# Patient Record
Sex: Female | Born: 2000 | Race: Black or African American | Hispanic: No | Marital: Single | Smoking: Never smoker
Health system: Southern US, Community
[De-identification: ages and names within clinical notes are randomized; demographics above are authoritative.]

## PROBLEM LIST (undated history)

## (undated) DIAGNOSIS — N83209 Unspecified ovarian cyst, unspecified side: Secondary | ICD-10-CM

## (undated) DIAGNOSIS — K219 Gastro-esophageal reflux disease without esophagitis: Secondary | ICD-10-CM

## (undated) HISTORY — PX: NO PAST SURGERIES: SHX2092

---
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
## 2019-08-21 ENCOUNTER — Ambulatory Visit: Payer: BLUE CROSS/BLUE SHIELD | Admitting: Internal Medicine

## 2019-08-21 NOTE — Progress Notes (Deleted)
Patient is a 19 year old female who presents today new to the practice.  Upon review of her record, she has most recently been seen in the [facility] system, with her having an infertility clinic visit in December 2020 with obstetrics and gynecology.. She also has had office visits with their internal medicine department twice in March, and once in early April for URI symptoms, bacterial vaginosis, and exposure to SARS most recently on 08/18/2019.  The Covid test was negative.  Previous visits have been through the emergency department. Her most recent visit was in January 2019 for episodes of dizziness.  At that visit, her BMP, CBC were normal, and her urinalysis and pregnancy test were negative.  Her past medical history is remarkable for GERD, eczema, lactose intolerance, and an ovarian cyst.

## 2019-10-20 ENCOUNTER — Other Ambulatory Visit: Payer: Self-pay

## 2019-10-20 ENCOUNTER — Encounter: Payer: Self-pay | Admitting: Emergency Medicine

## 2019-10-20 DIAGNOSIS — Z3A01 Less than 8 weeks gestation of pregnancy: Secondary | ICD-10-CM | POA: Insufficient documentation

## 2019-10-20 DIAGNOSIS — R102 Pelvic and perineal pain: Secondary | ICD-10-CM | POA: Insufficient documentation

## 2019-10-20 DIAGNOSIS — N898 Other specified noninflammatory disorders of vagina: Secondary | ICD-10-CM | POA: Diagnosis not present

## 2019-10-20 DIAGNOSIS — Z5321 Procedure and treatment not carried out due to patient leaving prior to being seen by health care provider: Secondary | ICD-10-CM | POA: Diagnosis not present

## 2019-10-20 DIAGNOSIS — O26891 Other specified pregnancy related conditions, first trimester: Secondary | ICD-10-CM | POA: Insufficient documentation

## 2019-10-20 LAB — URINALYSIS, COMPLETE (UACMP) WITH MICROSCOPIC
Bacteria, UA: NONE SEEN
Bilirubin Urine: NEGATIVE
Glucose, UA: NEGATIVE mg/dL
Hgb urine dipstick: NEGATIVE
Ketones, ur: NEGATIVE mg/dL
Leukocytes,Ua: NEGATIVE
Nitrite: NEGATIVE
Protein, ur: NEGATIVE mg/dL
Specific Gravity, Urine: 1.028 (ref 1.005–1.030)
pH: 6 (ref 5.0–8.0)

## 2019-10-20 LAB — COMPREHENSIVE METABOLIC PANEL
ALT: 22 U/L (ref 0–44)
AST: 23 U/L (ref 15–41)
Albumin: 4 g/dL (ref 3.5–5.0)
Alkaline Phosphatase: 40 U/L (ref 38–126)
Anion gap: 5 (ref 5–15)
BUN: 15 mg/dL (ref 6–20)
CO2: 26 mmol/L (ref 22–32)
Calcium: 8.9 mg/dL (ref 8.9–10.3)
Chloride: 104 mmol/L (ref 98–111)
Creatinine, Ser: 0.81 mg/dL (ref 0.44–1.00)
GFR calc Af Amer: >60 mL/min (ref >60)
GFR calc non Af Amer: >60 mL/min (ref >60)
Glucose, Bld: 100 mg/dL — ABNORMAL HIGH (ref 70–99)
Potassium: 3.8 mmol/L (ref 3.5–5.1)
Sodium: 135 mmol/L (ref 135–145)
Total Bilirubin: 0.7 mg/dL (ref 0.3–1.2)
Total Protein: 7.5 g/dL (ref 6.5–8.1)

## 2019-10-20 LAB — CBC
HCT: 34.1 % — ABNORMAL LOW (ref 36.0–46.0)
Hemoglobin: 11.3 g/dL — ABNORMAL LOW (ref 12.0–15.0)
MCH: 28.1 pg (ref 26.0–34.0)
MCHC: 33.1 g/dL (ref 30.0–36.0)
MCV: 84.8 fL (ref 80.0–100.0)
Platelets: 296 10*3/uL (ref 150–400)
RBC: 4.02 MIL/uL (ref 3.87–5.11)
RDW: 12.8 % (ref 11.5–15.5)
WBC: 8.8 10*3/uL (ref 4.0–10.5)
nRBC: 0 % (ref 0.0–0.2)

## 2019-10-20 LAB — ABO/RH: ABO/RH(D): A POS

## 2019-10-20 MED ORDER — SODIUM CHLORIDE 0.9% FLUSH: 3.0000 mL | Freq: Once | INTRAVENOUS | Status: DC

## 2019-10-20 NOTE — ED Triage Notes (Signed)
Pt to ED from work c/o vaginal discharge and vaginal cramping that started around 2215.  Pt is [redacted] weeks pregnant without complications, had a prior miscarriage.

## 2019-10-21 ENCOUNTER — Emergency Department
Admission: EM | Admit: 2019-10-21 | Discharge: 2019-10-21 | Disposition: A | Discharge status: Home / Self Care | Payer: PRIVATE HEALTH INSURANCE | Attending: Emergency Medicine | Admitting: Emergency Medicine

## 2019-10-21 LAB — HCG, QUANTITATIVE, PREGNANCY: hCG, Beta Chain, Quant, S: 24496 m[IU]/mL — ABNORMAL HIGH (ref <5)

## 2019-11-11 DIAGNOSIS — O09899 Supervision of other high risk pregnancies, unspecified trimester: Secondary | ICD-10-CM | POA: Insufficient documentation

## 2019-11-11 DIAGNOSIS — Z2839 Other underimmunization status: Secondary | ICD-10-CM | POA: Insufficient documentation

## 2019-11-19 ENCOUNTER — Telehealth: Payer: Self-pay | Admitting: Obstetrics & Gynecology

## 2019-11-19 NOTE — Telephone Encounter (Signed)
Pt called about referral for [facility] from [facility] but nothing in chart and [place] Dec was at lunch to ask. Advised medicaid plan she had was OON but still can be seen but in order to continue care with [place] she would have to get it changed to one we take. Pt got attitude and hung up.

## 2019-11-25 ENCOUNTER — Telehealth: Payer: Self-pay | Admitting: Obstetrics & Gynecology

## 2019-11-25 NOTE — Telephone Encounter (Signed)
Sabrina Singh referring for Pregnancy examination or positive result. Medicaid on file not in network will need to advise patient. Paper records. Called and left voicemail for patient to call back to be scheduled.

## 2019-12-09 ENCOUNTER — Ambulatory Visit (INDEPENDENT_AMBULATORY_CARE_PROVIDER_SITE_OTHER): Payer: Medicaid Other | Admitting: Advanced Practice Midwife

## 2019-12-09 ENCOUNTER — Encounter: Payer: Self-pay | Admitting: Advanced Practice Midwife

## 2019-12-09 ENCOUNTER — Other Ambulatory Visit: Payer: Self-pay

## 2019-12-09 VITALS — BP 90/56 | HR 87 | Wt 177.0 lb

## 2019-12-09 DIAGNOSIS — Z3A13 13 weeks gestation of pregnancy: Secondary | ICD-10-CM

## 2019-12-09 DIAGNOSIS — Z3481 Encounter for supervision of other normal pregnancy, first trimester: Secondary | ICD-10-CM

## 2019-12-09 DIAGNOSIS — Z3490 Encounter for supervision of normal pregnancy, unspecified, unspecified trimester: Secondary | ICD-10-CM | POA: Insufficient documentation

## 2019-12-09 DIAGNOSIS — Z3482 Encounter for supervision of other normal pregnancy, second trimester: Secondary | ICD-10-CM

## 2019-12-09 NOTE — Progress Notes (Signed)
New Obstetric Patient H&P    Chief Complaint: "Desires prenatal care"   History of Present Illness: Patient is a 19 y.o. G2P0010 Not Hispanic or Latino female, presents with amenorrhea and positive home pregnancy test. Patient's last menstrual period was 09/09/2019. and based on her  LMP, her EDD is Estimated Date of Delivery: 06/15/20 and her EGA is [redacted]w[redacted]d. Cycles are 5 days, regular, and occur approximately every : 28 days. Her last pap smear was 1 years ago and was no abnormalities.    She had a urine pregnancy test which was positive 6 week(s)  ago. Her last menstrual period was normal and lasted for  5 day(s). Since her LMP she claims she has experienced breast tenderness, fatigue, nausea, vomiting. She denies vaginal bleeding. Her past medical history is noncontributory. Her prior pregnancies are notable for SAB 2017  Since her LMP, she admits to the use of tobacco products  no She claims she has gained   7 pounds since the start of her pregnancy.  There are cats in the home in the home  no  She admits close contact with children on a regular basis  no  She has had chicken pox in the past no She has had Tuberculosis exposures, symptoms, or previously tested positive for TB   no Current or past history of domestic violence. no  Genetic Screening/Teratology Counseling: (Includes patient, baby's father, or anyone in either family with:)   1. Patient's age >/= 52 at [facility]  no 2. Thalassemia ([place], [place], Mediterranean, or Asian background): MCV<80  no 3. Neural tube defect (meningomyelocele, spina bifida, anencephaly)  no 4. Congenital heart defect  no  5. Down syndrome  no 6. Tay-Sachs (Jewish, [place]  no 7. Canavan's Disease  no 8. Sickle cell disease or trait (African)  no  9. Hemophilia or other blood disorders  no  10. Muscular dystrophy  no  11. Cystic fibrosis  no  12. Huntington's Chorea  no  13. Mental retardation/autism  no 14. Other inherited genetic or  chromosomal disorder  no 15. Maternal metabolic disorder (DM, PKU, etc)  no 16. Patient or FOB with a child with a birth defect not listed above no  16a. Patient or FOB with a birth defect themselves no 17. Recurrent pregnancy loss, or stillbirth  no  18. Any medications since LMP other than prenatal vitamins (include vitamins, supplements, OTC meds, drugs, alcohol)  no 19. Any other genetic/environmental exposure to discuss  no  Infection History:   1. Lives with someone with TB or TB exposed  no  2. Patient or partner has history of genital herpes  no 3. Rash or viral illness since LMP  no 4. History of STI (GC, CT, HPV, syphilis, HIV)  CT tx'ed 2 years ago, Trich tx'ed 6 years ago 5. History of recent travel :  no  Other pertinent information:  no     Review of Systems:10 point review of systems negative unless otherwise noted in HPI  Past Medical History:  Patient Active Problem List   Diagnosis Date Noted  . Supervision of normal pregnancy 12/09/2019    Clinic [facility]  Dating  Blood type: --/--/A POS Performed at [facility], [address] [place], [place] [address]  [phone])   Genetic Screen 1 Screen:    AFP:     Quad:     NIPS: Antibody: negative  Anatomic [place]  Rubella: Immune  Varicella: Non-immune  GTT Early: 99  Third trimester:  RPR: non-reactive   Rhogam  HBsAg: negative   Vaccines TDAP:                       Flu Shot: Covid: HIV: negative   Baby Food undecided                               GBS:   GC/CT: negative/negative  Contraception  Pap: 04/09/19 negative  CBB     CS/VBAC NA   Support Person [name]         Past Surgical History:  History reviewed. No pertinent surgical history.  Gynecologic History: Patient's last menstrual period was 09/09/2019.  Obstetric History: G2P0010  Family History:  History reviewed. No pertinent family history.  Social History:  Social History   Socioeconomic History   . Marital status: Single    Spouse name: Not on file  . Number of children: Not on file  . Years of education: Not on file  . Highest education level: Not on file  Occupational History  . Not on file  Tobacco Use  . Smoking status: Never Smoker  . Smokeless tobacco: Never Used  Substance and Sexual Activity  . Alcohol use: No  . Drug use: No  . Sexual activity: Yes    Birth control/protection: None  Other Topics Concern  . Not on file  Social History Narrative  . Not on file   Social Determinants of Health   Financial Resource Strain:   . Difficulty of Paying Living Expenses:   Food Insecurity:   . Worried About Programme researcher, broadcasting/film/video in the Last Year:   . Barista in the Last Year:   Transportation Needs:   . Freight forwarder (Medical):   Marland Kitchen Lack of Transportation (Non-Medical):   Physical Activity:   . Days of Exercise per Week:   . Minutes of Exercise per Session:   Stress:   . Feeling of Stress :   Social Connections:   . Frequency of Communication with Friends and Family:   . Frequency of Social Gatherings with Friends and Family:   . Attends Religious Services:   . Active Member of Clubs or Organizations:   . Attends Banker Meetings:   Marland Kitchen Marital Status:   Intimate Partner Violence:   . Fear of Current or Ex-Partner:   . Emotionally Abused:   Marland Kitchen Physically Abused:   . Sexually Abused:     Allergies:  No Known Allergies  Medications: Prior to Admission medications   Medication Sig Start Date End Date Taking? Authorizing Provider  Prenat MV-Min-Methylfolate-FA (PRENATE) 0.6-0.4 MG CHEW Chew by mouth. 11/02/15  Yes [provider]    Physical Exam Vitals: Blood pressure (!) 90/56, pulse 87, weight 177 lb (80.3 kg), last menstrual period 09/09/2019  General: NAD HEENT: normocephalic, anicteric Thyroid: no enlargement, no palpable nodules Pulmonary: No increased work of breathing, CTAB Cardiovascular: RRR, distal pulses  2+ Abdomen: NABS, soft, non-tender, non-distended.  Umbilicus without lesions.  No hepatomegaly, splenomegaly or masses palpable. No evidence of hernia. FHTs 170s Genitourinary: deferred for PAP interval/already had aptima Extremities: no edema, erythema, or tenderness Neurologic: Grossly intact Psychiatric: mood appropriate, affect full  The following were addressed during this visit:  Breastfeeding Education - Early initiation of breastfeeding    Comments: Keeps milk supply adequate, helps contract uterus and slow bleeding, and early milk is the perfect  first food and is easy to digest.   - The importance of exclusive breastfeeding    Comments: Provides antibodies, Lower risk of breast and ovarian cancers, and type-2 diabetes,Helps your body recover, Reduced chance of SIDS.   - Risks of giving your baby anything other than breast milk if you are breastfeeding    Comments: Make the baby less content with breastfeeds, may make my baby more susceptible to illness, and may reduce my milk supply.   - The importance of early skin-to-skin contact    Comments: Keeps baby warm and secure, helps keep baby's blood sugar up and breathing steady, easier to bond and breastfeed, and helps calm baby.  - Rooming-in on a 24-hour basis    Comments: Easier to learn baby's feeding cues, easier to bond and get to know each other, and encourages milk production.   - Feeding on demand or baby-led feeding    Comments: Helps prevent breastfeeding complications, helps bring in good milk supply, prevents under or overfeeding, and helps baby feel content and satisfied   - Frequent feeding to help assure optimal milk production    Comments: Making a full supply of milk requires frequent removal of milk from breasts, infant will eat 8-12 times in 24 hours, if separated from infant use breast massage, hand expression and/ or pumping to remove milk from breasts.   - Effective positioning and attachment     Comments: Helps my baby to get enough breast milk, helps to produce an adequate milk supply, and helps prevent nipple pain and damage   - Exclusive breastfeeding for the first 6 months    Comments: Builds a healthy milk supply and keeps it up, protects baby from sickness and disease, and breastmilk has everything your baby needs for the first 6 months.    Assessment: 19 y.o. G2P0010 at [redacted]w[redacted]d by LMP presenting to initiate prenatal care  Plan: 1) Avoid alcoholic beverages. 2) Patient encouraged not to smoke.  3) Discontinue the use of all non-medicinal drugs and chemicals.  4) Take prenatal vitamins daily.  5) Nutrition, food safety (fish, cheese advisories, and high nitrite foods) and exercise discussed. 6) Hospital and practice style discussed with cross coverage system.  7) Genetic Screening, such as with 1st Trimester Screening, cell free fetal DNA, AFP testing, and Ultrasound, as well as with amniocentesis and CVS as appropriate, is discussed with patient. At the conclusion of today's visit patient requested cell free DNA genetic testing 8) Patient is asked about travel to areas at risk for the Zika virus, and counseled to avoid travel and exposure to mosquitoes or sexual partners who may have themselves been exposed to the virus. Testing is discussed, and will be ordered as appropriate. 9) MaterniT 21 today  10) Return to clinic in 1 week for dating scan and rob 58) Magnesium supplement for migraines   Tresea Mall, CNM [facility] 12/09/2019, 5:39 PM

## 2019-12-09 NOTE — Progress Notes (Signed)
NOB Migraines

## 2019-12-09 NOTE — Patient Instructions (Signed)
Exercise During Pregnancy Exercise is an important part of being healthy for people of all ages. Exercise improves the function of your heart and lungs and helps you maintain strength, flexibility, and a healthy body weight. Exercise also boosts energy levels and elevates mood. Most women should exercise regularly during pregnancy. In rare cases, women with certain medical conditions or complications may be asked to limit or avoid exercise during pregnancy. How does this affect me? Along with maintaining general strength and flexibility, exercising during pregnancy can help:  Keep strength in muscles that are used during labor and childbirth.  Decrease low back pain.  Reduce symptoms of depression.  Control weight gain during pregnancy.  Reduce the risk of needing insulin if you develop diabetes during pregnancy.  Decrease the risk of cesarean delivery.  Speed up your recovery after giving birth. How does this affect my baby? Exercise can help you have a healthy pregnancy. Exercise does not cause premature birth. It will not cause your baby to weigh less at birth. What exercises can I do? Many exercises are safe for you to do during pregnancy. Do a variety of exercises that safely increase your heart and breathing rates and help you build and maintain muscle strength. Do exercises exactly as told by your health care provider. You may do these exercises:  Walking or hiking.  Swimming.  Water aerobics.  Riding a stationary bike.  Strength training.  Modified yoga or Pilates. Tell your instructor that you are pregnant. Avoid overstretching, and avoid lying on your back for long periods of time.  Running or jogging. Only choose this type of exercise if you: ? Ran or jogged regularly before your pregnancy. ? Can run or jog and still talk in complete sentences. What exercises should I avoid? Depending on your level of fitness and whether you exercised regularly before your  pregnancy, you may be told to limit high-intensity exercise. You can tell that you are exercising at a high intensity if you are breathing much harder and faster and cannot hold a conversation while exercising. You must avoid:  Contact sports.  Activities that put you at risk for falling on or being hit in the belly, such as downhill skiing, water skiing, surfing, rock climbing, cycling, gymnastics, and horseback riding.  Scuba diving.  Skydiving.  Yoga or Pilates in a room that is heated to high temperatures.  Jogging or running, unless you ran or jogged regularly before your pregnancy. While jogging or running, you should always be able to talk in full sentences. Do not run or jog so fast that you are unable to have a conversation.  Do not exercise at more than 6,000 feet above sea level (high elevation) if you are not used to exercising at high elevation. How do I exercise in a safe way?   Avoid overheating. Do not exercise in very high temperatures.  Wear loose-fitting, breathable clothes.  Avoid dehydration. Drink enough water before, during, and after exercise to keep your urine pale yellow.  Avoid overstretching. Because of hormone changes during pregnancy, it is easy to overstretch muscles, tendons, and ligaments during pregnancy.  Start slowly and ask your health care provider to recommend the types of exercise that are safe for you.  Do not exercise to lose weight. Follow these instructions at home:  Exercise on most days or all days of the week. Try to exercise for 30 minutes a day, 5 days a week, unless your health care provider tells you not to.  If   you actively exercised before your pregnancy and you are healthy, your health care provider may tell you to continue to do moderate to high-intensity exercise.  If you are just starting to exercise or did not exercise much before your pregnancy, your health care provider may tell you to do low to moderate-intensity  exercise. Questions to ask your health care provider  Is exercise safe for me?  What are signs that I should stop exercising?  Does my health condition mean that I should not exercise during pregnancy?  When should I avoid exercising during pregnancy? Stop exercising and contact a health care provider if: You have any unusual symptoms, such as:  Mild contractions of the uterus or cramps in the abdomen.  Dizziness that does not go away when you rest. Stop exercising and get help right away if: You have any unusual symptoms, such as:  Sudden, severe pain in your low back or your belly.  Mild contractions of the uterus or cramps in the abdomen that do not improve with rest and drinking fluids.  Chest pain.  Bleeding or fluid leaking from your vagina.  Shortness of breath. These symptoms may represent a serious problem that is an emergency. Do not wait to see if the symptoms will go away. Get medical help right away. Call your local emergency services (911 in the [place] Do not drive yourself to the hospital. Summary  Most women should exercise regularly throughout pregnancy. In rare cases, women with certain medical conditions or complications may be asked to limit or avoid exercise during pregnancy.  Do not exercise to lose weight during pregnancy.  Your health care provider will tell you what level of physical activity is right for you.  Stop exercising and contact a health care provider if you have mild contractions of the uterus or cramps in the abdomen. Get help right away if these contractions or cramps do not improve with rest and drinking fluids.  Stop exercising and get help right away if you have sudden, severe pain in your low back or belly, chest pain, shortness of breath, or bleeding or leaking of fluid from your vagina. This information is not intended to replace advice given to you by your health care provider. Make sure you discuss any questions you have with your  health care provider. Document Revised: 08/14/2018 Document Reviewed: 05/28/2018 Elsevier Patient Education  2020 [employer] Eating Plan for Pregnant Women While you are pregnant, your body requires additional nutrition to help support your growing baby. You also have a higher need for some vitamins and minerals, such as folic acid, calcium, iron, and vitamin D. Eating a healthy, well-balanced diet is very important for your health and your baby's health. Your need for extra calories varies for the three 3-month segments of your pregnancy (trimesters). For most women, it is recommended to consume:  150 extra calories a day during the first trimester.  300 extra calories a day during the second trimester.  300 extra calories a day during the third trimester. What are tips for following this plan?   Do not try to lose weight or go on a diet during pregnancy.  Limit your overall intake of foods that have "empty calories." These are foods that have little nutritional value, such as sweets, desserts, candies, and sugar-sweetened beverages.  Eat a variety of foods (especially fruits and vegetables) to get a full range of vitamins and minerals.  Take a prenatal vitamin to help meet your additional vitamin and mineral needs   during pregnancy, specifically for folic acid, iron, calcium, and vitamin D.  Remember to stay active. Ask your health care provider what types of exercise and activities are safe for you.  Practice good food safety and cleanliness. Wash your hands before you eat and after you prepare raw meat. Wash all fruits and vegetables well before peeling or eating. Taking these actions can help to prevent food-borne illnesses that can be very dangerous to your baby, such as listeriosis. Ask your health care provider for more information about listeriosis. What does 150 extra calories look like? Healthy options that provide 150 extra calories each day could be any of the  following:  6-8 oz (170-230 g) of plain low-fat yogurt with  cup of berries.  1 apple with 2 teaspoons (11 g) of peanut butter.  Cut-up vegetables with  cup (60 g) of hummus.  8 oz (230 mL) or 1 cup of low-fat chocolate milk.  1 stick of string cheese with 1 medium orange.  1 peanut butter and jelly sandwich that is made with one slice of whole-wheat bread and 1 tsp (5 g) of peanut butter. For 300 extra calories, you could eat two of those healthy options each day. What is a healthy amount of weight to gain? The right amount of weight gain for you is based on your BMI before you became pregnant. If your BMI:  Was less than 18 (underweight), you should gain 28-40 lb (13-18 kg).  Was 18-24.9 (normal), you should gain 25-35 lb (11-16 kg).  Was 25-29.9 (overweight), you should gain 15-25 lb (7-11 kg).  Was 30 or greater (obese), you should gain 11-20 lb (5-9 kg). What if I am having twins or multiples? Generally, if you are carrying twins or multiples:  You may need to eat 300-600 extra calories a day.  The recommended range for total weight gain is 25-54 lb (11-25 kg), depending on your BMI before pregnancy.  Talk with your health care provider to find out about nutritional needs, weight gain, and exercise that is right for you. What foods can I eat?  Fruits All fruits. Eat a variety of colors and types of fruit. Remember to wash your fruits well before peeling or eating. Vegetables All vegetables. Eat a variety of colors and types of vegetables. Remember to wash your vegetables well before peeling or eating. Grains All grains. Choose whole grains, such as whole-wheat bread, oatmeal, or brown rice. Meats and other protein foods Lean meats, including chicken, turkey, fish, and lean cuts of beef, veal, or pork. If you eat fish or seafood, choose options that are higher in omega-3 fatty acids and lower in mercury, such as salmon, herring, mussels, trout, sardines, pollock,  shrimp, crab, and lobster. Tofu. Tempeh. Beans. Eggs. Peanut butter and other nut butters. Make sure that all meats, poultry, and eggs are cooked to food-safe temperatures or "well-done." Two or more servings of fish are recommended each week in order to get the most benefits from omega-3 fatty acids that are found in seafood. Choose fish that are lower in mercury. You can find more information online:  www.fda.gov Dairy Pasteurized milk and milk alternatives (such as almond milk). Pasteurized yogurt and pasteurized cheese. Cottage cheese. Sour cream. Beverages Water. Juices that contain 100% fruit juice or vegetable juice. Caffeine-free teas and decaffeinated coffee. Drinks that contain caffeine are okay to drink, but it is better to avoid caffeine. Keep your total caffeine intake to less than 200 mg each day (which is 12 oz   or 355 mL of coffee, tea, or soda) or the limit as told by your health care provider. Fats and oils Fats and oils are okay to include in moderation. Sweets and desserts Sweets and desserts are okay to include in moderation. Seasoning and other foods All pasteurized condiments. The items listed above may not be a complete list of foods and beverages you can eat. Contact a dietitian for more information. What foods are not recommended? Fruits Unpasteurized fruit juices. Vegetables Raw (unpasteurized) vegetable juices. Meats and other protein foods Lunch meats, bologna, hot dogs, or other deli meats. (If you must eat those meats, reheat them until they are steaming hot.) Refrigerated pat, meat spreads from a meat counter, smoked seafood that is found in the refrigerated section of a store. Raw or undercooked meats, poultry, and eggs. Raw fish, such as sushi or sashimi. Fish that have high mercury content, such as tilefish, shark, swordfish, and king mackerel. To learn more about mercury in fish, talk with your health care provider or look for online resources, such  as:  www.fda.gov Dairy Raw (unpasteurized) milk and any foods that have raw milk in them. Soft cheeses, such as feta, queso blanco, queso fresco, Brie, Camembert cheeses, blue-veined cheeses, and Panela cheese (unless it is made with pasteurized milk, which must be stated on the label). Beverages Alcohol. Sugar-sweetened beverages, such as sodas, teas, or energy drinks. Seasoning and other foods Homemade fermented foods and drinks, such as pickles, sauerkraut, or kombucha drinks. (Store-bought pasteurized versions of these are okay.) Salads that are made in a store or deli, such as ham salad, chicken salad, egg salad, tuna salad, and seafood salad. The items listed above may not be a complete list of foods and beverages you should avoid. Contact a dietitian for more information. Where to find more information To calculate the number of calories you need based on your height, weight, and activity level, you can use an online calculator such as:  www.choosemyplate.gov/MyPlatePlan To calculate how much weight you should gain during pregnancy, you can use an online pregnancy weight gain calculator such as:  www.choosemyplate.gov/pregnancy-weight-gain-calculator Summary  While you are pregnant, your body requires additional nutrition to help support your growing baby.  Eat a variety of foods, especially fruits and vegetables to get a full range of vitamins and minerals.  Practice good food safety and cleanliness. Wash your hands before you eat and after you prepare raw meat. Wash all fruits and vegetables well before peeling or eating. Taking these actions can help to prevent food-borne illnesses, such as listeriosis, that can be very dangerous to your baby.  Do not eat raw meat or fish. Do not eat fish that have high mercury content, such as tilefish, shark, swordfish, and king mackerel. Do not eat unpasteurized (raw) dairy.  Take a prenatal vitamin to help meet your additional vitamin and  mineral needs during pregnancy, specifically for folic acid, iron, calcium, and vitamin D. This information is not intended to replace advice given to you by your health care provider. Make sure you discuss any questions you have with your health care provider. Document Revised: 09/11/2018 Document Reviewed: 01/18/2017 Elsevier Patient Education  2020 Elsevier Inc. Prenatal Care Prenatal care is health care during pregnancy. It helps you and your unborn baby (fetus) stay as healthy as possible. Prenatal care may be provided by a midwife, a family practice health care provider, or a childbirth and pregnancy specialist (obstetrician). How does this affect me? During pregnancy, you will be closely monitored   for any new conditions that might develop. To lower your risk of pregnancy complications, you and your health care provider will talk about any underlying conditions you have. How does this affect my baby? Early and consistent prenatal care increases the chance that your baby will be healthy during pregnancy. Prenatal care lowers the risk that your baby will be:  Born early (prematurely).  Smaller than expected at birth (small for gestational age). What can I expect at the first prenatal care visit? Your first prenatal care visit will likely be the longest. You should schedule your first prenatal care visit as soon as you know that you are pregnant. Your first visit is a good time to talk about any questions or concerns you have about pregnancy. At your visit, you and your health care provider will talk about:  Your medical history, including: ? Any past pregnancies. ? Your family's medical history. ? The baby's father's medical history. ? Any long-term (chronic) health conditions you have and how you manage them. ? Any surgeries or procedures you have had. ? Any current over-the-counter or prescription medicines, herbs, or supplements you are taking.  Other factors that could pose a risk  to your baby, including:  Your home setting and your stress levels, including: ? Exposure to abuse or violence. ? Household financial strain. ? Mental health conditions you have.  Your daily health habits, including diet and exercise. Your health care provider will also:  Measure your weight, height, and blood pressure.  Do a physical exam, including a pelvic and breast exam.  Perform blood tests and urine tests to check for: ? Urinary tract infection. ? Sexually transmitted infections (STIs). ? Low iron levels in your blood (anemia). ? Blood type and certain proteins on red blood cells (Rh antibodies). ? Infections and immunity to viruses, such as hepatitis B and rubella. ? HIV (human immunodeficiency virus).  Do an ultrasound to confirm your baby's growth and development and to help predict your estimated due date (EDD). This ultrasound is done with a probe that is inserted into the vagina (transvaginal ultrasound).  Discuss your options for genetic screening.  Give you information about how to keep yourself and your baby healthy, including: ? Nutrition and taking vitamins. ? Physical activity. ? How to manage pregnancy symptoms such as nausea and vomiting (morning sickness). ? Infections and substances that may be harmful to your baby and how to avoid them. ? Food safety. ? Dental care. ? Working. ? Travel. ? Warning signs to watch for and when to call your health care provider. How often will I have prenatal care visits? After your first prenatal care visit, you will have regular visits throughout your pregnancy. The visit schedule is often as follows:  Up to week 28 of pregnancy: once every 4 weeks.  28-36 weeks: once every 2 weeks.  After 36 weeks: every week until delivery. Some women may have visits more or less often depending on any underlying health conditions and the health of the baby. Keep all follow-up and prenatal care visits as told by your health care  provider. This is important. What happens during routine prenatal care visits? Your health care provider will:  Measure your weight and blood pressure.  Check for fetal heart sounds.  Measure the height of your uterus in your abdomen (fundal height). This may be measured starting around week 20 of pregnancy.  Check the position of your baby inside your uterus.  Ask questions about your diet, sleeping patterns, and   whether you can feel the baby move.  Review warning signs to watch for and signs of labor.  Ask about any pregnancy symptoms you are having and how you are dealing with them. Symptoms may include: ? Headaches. ? Nausea and vomiting. ? Vaginal discharge. ? Swelling. ? Fatigue. ? Constipation. ? Any discomfort, including back or pelvic pain. Make a list of questions to ask your health care provider at your routine visits. What tests might I have during prenatal care visits? You may have blood, urine, and imaging tests throughout your pregnancy, such as:  Urine tests to check for glucose, protein, or signs of infection.  Glucose tests to check for a form of diabetes that can develop during pregnancy (gestational diabetes mellitus). This is usually done around week 24 of pregnancy.  An ultrasound to check your baby's growth and development and to check for birth defects. This is usually done around week 20 of pregnancy.  A test to check for group B strep (GBS) infection. This is usually done around week 36 of pregnancy.  Genetic testing. This may include blood or imaging tests, such as an ultrasound. Some genetic tests are done during the first trimester and some are done during the second trimester. What else can I expect during prenatal care visits? Your health care provider may recommend getting certain vaccines during pregnancy. These may include:  A yearly flu shot (annual influenza vaccine). This is especially important if you will be pregnant during flu  season.  Tdap (tetanus, diphtheria, pertussis) vaccine. Getting this vaccine during pregnancy can protect your baby from whooping cough (pertussis) after birth. This vaccine may be recommended between weeks 27 and 36 of pregnancy. Later in your pregnancy, your health care provider may give you information about:  Childbirth and breastfeeding classes.  Choosing a health care provider for your baby.  Umbilical cord banking.  Breastfeeding.  Birth control after your baby is born.  The hospital labor and delivery unit and how to tour it.  Registering at the hospital before you go into labor. Where to find more information  Office on Women's Health: womenshealth.gov  American Pregnancy Association: americanpregnancy.org  March of Dimes: marchofdimes.org Summary  Prenatal care helps you and your baby stay as healthy as possible during pregnancy.  Your first prenatal care visit will most likely be the longest.  You will have visits and tests throughout your pregnancy to monitor your health and your baby's health.  Bring a list of questions to your visits to ask your health care provider.  Make sure to keep all follow-up and prenatal care visits with your health care provider. This information is not intended to replace advice given to you by your health care provider. Make sure you discuss any questions you have with your health care provider. Document Revised: 08/13/2018 Document Reviewed: 04/22/2017 Elsevier Patient Education  2020 Elsevier Inc.  

## 2019-12-15 ENCOUNTER — Other Ambulatory Visit: Payer: Self-pay

## 2019-12-15 ENCOUNTER — Ambulatory Visit (INDEPENDENT_AMBULATORY_CARE_PROVIDER_SITE_OTHER): Payer: Medicaid Other

## 2019-12-15 ENCOUNTER — Ambulatory Visit (INDEPENDENT_AMBULATORY_CARE_PROVIDER_SITE_OTHER): Payer: Medicaid Other | Admitting: Certified Nurse Midwife

## 2019-12-15 VITALS — BP 99/62 | Wt 179.0 lb

## 2019-12-15 DIAGNOSIS — Z3A13 13 weeks gestation of pregnancy: Secondary | ICD-10-CM

## 2019-12-15 DIAGNOSIS — Z3482 Encounter for supervision of other normal pregnancy, second trimester: Secondary | ICD-10-CM | POA: Diagnosis not present

## 2019-12-15 DIAGNOSIS — Z3481 Encounter for supervision of other normal pregnancy, first trimester: Secondary | ICD-10-CM

## 2019-12-15 LAB — POCT URINALYSIS DIPSTICK OB
Glucose, UA: NEGATIVE
POC,PROTEIN,UA: NEGATIVE

## 2019-12-15 NOTE — Progress Notes (Signed)
No concerns.rj 

## 2019-12-18 ENCOUNTER — Other Ambulatory Visit: Payer: Self-pay

## 2019-12-18 ENCOUNTER — Ambulatory Visit (INDEPENDENT_AMBULATORY_CARE_PROVIDER_SITE_OTHER): Payer: Medicaid Other

## 2019-12-18 DIAGNOSIS — R1030 Lower abdominal pain, unspecified: Secondary | ICD-10-CM | POA: Diagnosis not present

## 2019-12-18 LAB — MATERNIT 21 PLUS CORE, BLOOD
Fetal Fraction: 8
Result (T21): NEGATIVE
Trisomy 13 (Patau syndrome): NEGATIVE
Trisomy 18 (Edwards syndrome): NEGATIVE
Trisomy 21 (Down syndrome): NEGATIVE

## 2019-12-18 LAB — POCT URINALYSIS DIPSTICK
Bilirubin, UA: NEGATIVE
Blood, UA: NEGATIVE
Glucose, UA: NEGATIVE
Ketones, UA: NEGATIVE
Leukocytes, UA: NEGATIVE
Nitrite, UA: NEGATIVE
Protein, UA: POSITIVE — AB
Spec Grav, UA: >=1.03 — AB
Urobilinogen, UA: NEGATIVE U/dL — AB
pH, UA: 5

## 2019-12-18 NOTE — Progress Notes (Addendum)
Pt dropped in to see if we would ck her urine; was added to nurses' sched; c/o urine feels extra hot when she pees, has white thin d/c, x3d;  Lower abd pain this am.  [facility] [address]; also hasn't heard from blood work results.  [phone]  Pt aware per CRS to drink more water.  We are sending a urine culture and should have the results Monday.  As for blood work, what was drawn earlier this week is not back yet but the MaterniT 21 is back.  Pt aware of neg results and gender put in envelop for pt to p/u.

## 2019-12-18 NOTE — Addendum Note (Signed)
Addended by: Loran Senters D on: 12/18/2019 11:43 AM   Modules accepted: Orders

## 2019-12-23 ENCOUNTER — Telehealth: Payer: Self-pay

## 2019-12-23 NOTE — Telephone Encounter (Signed)
Patient has a few questions about her pregnancy. Also inquiring about urine results from Friday. MY#[phone]

## 2019-12-23 NOTE — Telephone Encounter (Signed)
Spoke w/patients. Advised urine culture hasn't resulted yet. She states she had intercourse w/boyfriend about a week ago. He didn't pull out. Her symptoms started a few days later (creamy white d/c w/o itching). She also has migraines on occasion. Advised can take tylenol (two regular strength or one extra strength), also can have caffeine including chocolate (less than 200 milligrams per day) as caffeine can sometimes help with headaches. If not relieved by these methods, should contact us for evaluation/provider advice/rx.

## 2019-12-23 NOTE — Telephone Encounter (Signed)
Patient aware. Advised to arrive at 11:15. Apt scheduled.

## 2019-12-23 NOTE — Telephone Encounter (Signed)
Please see if she can see me tomorrow at 1130 for an evaluation and to get blood drawn. Thanks, [employer]

## 2019-12-23 NOTE — Telephone Encounter (Signed)
LMVM TRC. 

## 2019-12-24 ENCOUNTER — Other Ambulatory Visit: Payer: Self-pay

## 2019-12-24 ENCOUNTER — Ambulatory Visit (INDEPENDENT_AMBULATORY_CARE_PROVIDER_SITE_OTHER): Payer: Medicaid Other | Admitting: Certified Nurse Midwife

## 2019-12-24 VITALS — BP 95/68 | Wt 179.0 lb

## 2019-12-24 DIAGNOSIS — B9689 Other specified bacterial agents as the cause of diseases classified elsewhere: Secondary | ICD-10-CM

## 2019-12-24 DIAGNOSIS — N760 Acute vaginitis: Secondary | ICD-10-CM

## 2019-12-24 DIAGNOSIS — Z3A15 15 weeks gestation of pregnancy: Secondary | ICD-10-CM

## 2019-12-24 DIAGNOSIS — Z3482 Encounter for supervision of other normal pregnancy, second trimester: Secondary | ICD-10-CM

## 2019-12-24 DIAGNOSIS — R109 Unspecified abdominal pain: Secondary | ICD-10-CM

## 2019-12-24 DIAGNOSIS — N898 Other specified noninflammatory disorders of vagina: Secondary | ICD-10-CM

## 2019-12-24 LAB — POCT URINALYSIS DIPSTICK OB
Glucose, UA: NEGATIVE
POC,PROTEIN,UA: NEGATIVE

## 2019-12-24 LAB — POCT URINALYSIS DIPSTICK
Blood, UA: NEGATIVE
Glucose, UA: NEGATIVE
Ketones, UA: NEGATIVE
Nitrite, UA: NEGATIVE
Protein, UA: NEGATIVE
Spec Grav, UA: >=1.03 — AB (ref 1.010–1.025)
Urobilinogen, UA: NEGATIVE E.U./dL — AB
pH, UA: 6 (ref 5.0–8.0)

## 2019-12-24 LAB — URINE CULTURE

## 2019-12-24 MED ORDER — METRONIDAZOLE 0.75 % VA GEL: VAGINAL | 0 refills | Status: DC

## 2019-12-24 NOTE — Progress Notes (Signed)
Work in appointment at 15wk1d: Has numerous concerns: lightheaded when standing out in the heat for a period of time, lower abdominal pressure, vaginal discharge with odor since last week, headaches behind her eyes x 2 months, hard stools and anxiety regarding her previous pregnancy loss and the possibility of something going wrong with this pregnancy.  Trying not to take any medications for headaches. Drinking water. Cut out caffeine. Has not been eating fruit (less fiber in her diet).   Exam: BP 95/68 Wt 179# ( no change) FH at U-1/ FHT 160 Pelvic exam: External/BUS: no inflammation or lesions  Vagina: white homogenous discharge, no lesions Wet prep: positive clue cells and negative hyphae, Trich  Urine dipstick: trace leukocytes, trace bili, 1.030, negative nitrite, negative protein Urine culture from last week still pending (has been replated)  A: IUP at 15wk1d  Bacterial vaginosis Headaches in pregnancy Anxiety regarding pregnancy  P: Metrogel vaginal gel: one appl qhs x 5 nites Discussed safe medications in pregnancy-can take Tylenol for headaches. OK to drink a caffeinated product daily Explained risk of miscarriage is much lower now that she is in her second trimester No IC until done with BV treatment NOB labs drawn (did not realize she had labs done at [facility] in June 2021) ROB as scheduled  Farrel Conners, CNM

## 2019-12-24 NOTE — Progress Notes (Signed)
C/o pressure lower abd; migraines; feels she needs to poop but nothing comes out.rj

## 2019-12-27 NOTE — Progress Notes (Signed)
ROB/ dating scan at 13wk6d : Doing well.   BP 99/62. Weight up 2#(179#). Negative proteinuria Dating scan: CRL 13wk6d with FCA 161. FHT 165 with DT Has already had MaterniT 74( diploid XX) but has not had NOB labs  A: IUP at 13wk6d. S=D on ultrasound  P: RTO 3 weeks for ROB and MSAFP NOB labs today  Farrel Conners, CNM

## 2019-12-29 LAB — RPR+RH+ABO+RUB AB+AB SCR+CB...
Antibody Screen: NEGATIVE
HIV Screen 4th Generation wRfx: NONREACTIVE
Hematocrit: 32.8 % — ABNORMAL LOW (ref 34.0–46.6)
Hemoglobin: 10.9 g/dL — ABNORMAL LOW (ref 11.1–15.9)
Hepatitis B Surface Ag: NEGATIVE
MCH: 27.6 pg (ref 26.6–33.0)
MCHC: 33.2 g/dL (ref 31.5–35.7)
MCV: 83 fL (ref 79–97)
Platelets: 272 10*3/uL (ref 150–450)
RBC: 3.95 x10E6/uL (ref 3.77–5.28)
RDW: 12.8 % (ref 11.7–15.4)
RPR Ser Ql: NONREACTIVE
Rh Factor: POSITIVE
Rubella Antibodies, IGG: 2.3 index (ref >0.99)
Varicella zoster IgG: <135 index — ABNORMAL LOW (ref >165)
WBC: 9.9 10*3/uL (ref 3.4–10.8)

## 2019-12-29 LAB — HGB FRACTIONATION CASCADE
Hgb A2: 2.5 % (ref 1.8–3.2)
Hgb A: 97.5 % (ref 96.4–98.8)
Hgb F: 0 % (ref 0.0–2.0)
Hgb S: 0 %

## 2019-12-30 ENCOUNTER — Encounter: Payer: Medicaid Other | Admitting: Obstetrics and Gynecology

## 2019-12-30 ENCOUNTER — Telehealth: Payer: Self-pay | Admitting: Certified Nurse Midwife

## 2019-12-30 ENCOUNTER — Telehealth: Payer: Self-pay

## 2019-12-30 NOTE — Telephone Encounter (Signed)
Patient is calling back from being seen at the [place] [facility] walk in. Needs to follow up with a nurse please advise

## 2019-12-30 NOTE — Telephone Encounter (Signed)
Pt aware since she was seen at [facility]  they will have to send her in a RX, pt states her RX is not at the pharmacy, I advised her to call [facility] where she was seen and discuss issue with them, [facility] can not send in a rx for another practice.

## 2019-12-30 NOTE — Telephone Encounter (Signed)
Patient is returning call from leaving message on Nurse line this morning. Patient thinks she will go to the walk in. Please call patient with what CLG advises. Cb# [phone]

## 2019-12-30 NOTE — Telephone Encounter (Signed)
Pt called triage reporting BV symptoms have worsen, vaginal itching burning heavy discharge, called to schedule an appointment pt is at a walk in clinic now.

## 2020-01-05 ENCOUNTER — Other Ambulatory Visit: Payer: Self-pay

## 2020-01-05 ENCOUNTER — Encounter: Payer: Medicaid Other | Admitting: Obstetrics & Gynecology

## 2020-01-05 ENCOUNTER — Ambulatory Visit (INDEPENDENT_AMBULATORY_CARE_PROVIDER_SITE_OTHER): Payer: Medicaid Other | Admitting: Obstetrics

## 2020-01-05 VITALS — BP 100/70 | Wt 184.0 lb

## 2020-01-05 DIAGNOSIS — Z3482 Encounter for supervision of other normal pregnancy, second trimester: Secondary | ICD-10-CM

## 2020-01-05 DIAGNOSIS — Z3A16 16 weeks gestation of pregnancy: Secondary | ICD-10-CM

## 2020-01-05 NOTE — Progress Notes (Signed)
  Routine Prenatal Care Visit  Subjective  Sabrina Singh is a 19 y.o. G2P0010 at [redacted]w[redacted]d being seen today for ongoing prenatal care.  She is currently monitored for the following issues for this low-risk pregnancy and has Supervision of normal pregnancy on their problem list.  ----------------------------------------------------------------------------------- Patient reports no bleeding, no contractions, no cramping, no leaking and and she has completed taking medication for both BV and vaginal yeast..    . Vag. Bleeding: None.   . Leaking Fluid denies.  ----------------------------------------------------------------------------------- The following portions of the patient's history were reviewed and updated as appropriate: allergies, current medications, past family history, past medical history, past social history, past surgical history and problem list. Problem list updated.  Objective  Blood pressure 100/70, weight 184 lb (83.5 kg), last menstrual period 09/09/2019, unknown if currently breastfeeding. Pregravid weight 170 lb (77.1 kg) Total Weight Gain 14 lb (6.35 kg) Urinalysis: Urine Protein    Urine Glucose    Fetal Status:           General:  Alert, oriented and cooperative. Patient is in no acute distress.  Skin: Skin is warm and dry. No rash noted.   Cardiovascular: Normal heart rate noted  Respiratory: Normal respiratory effort, no problems with respiration noted  Abdomen: Soft, gravid, appropriate for gestational age.       Pelvic:  Cervical exam deferred        Extremities: Normal range of motion.     Mental Status: Normal mood and affect. Normal behavior. Normal judgment and thought content.   Assessment   19 y.o. G2P0010 at [redacted]w[redacted]d by  06/15/2020, by Last Menstrual Period presenting for routine prenatal visit  Plan   Pregnancy#2 Problems (from 09/09/19 to present)    Problem Noted Resolved   Supervision of normal pregnancy 12/09/2019 by Tresea Mall, CNM No   Overview  Addendum 01/05/2020  5:21 PM by [name], CNM    Clinic [facility] Prenatal Labs  Dating LMP=13wk6d [place] Blood type:   A positive  Genetic Screen 1 Screen:    AFP:     Quad:     NIPS: diploid XX Antibody: negative  Anatomic [place] Ordered  Rubella: Immune Varicella: VNI  GTT Early: 12               Third trimester:  RPR: non reactive  Rhogam  HBsAg: neg  Vaccines TDAP:                       Flu Shot: Covid: HIV:  negative  Baby Food undecided                               GBS:   GC/CT: negative/negative  Contraception  Pap: 04/09/19 negative  CBB     CS/VBAC NA   Support Person [name]          Previous Version       Preterm labor symptoms and general obstetric precautions including but not limited to vaginal bleeding, contractions, leaking of fluid and fetal movement were reviewed in detail with the patient. Please refer to After Visit Summary for other counseling recommendations.  Discussed her anatomy ultrasound to be scheduled for three weeks hence. Reminded to take her prenatal vitamins  Return in about 3 weeks (around 01/26/2020) for return OB and anatomy sono.  [name], CNM  01/05/2020 5:22 PM

## 2020-01-26 ENCOUNTER — Ambulatory Visit (INDEPENDENT_AMBULATORY_CARE_PROVIDER_SITE_OTHER): Payer: Medicaid Other | Admitting: Obstetrics

## 2020-01-26 ENCOUNTER — Ambulatory Visit (INDEPENDENT_AMBULATORY_CARE_PROVIDER_SITE_OTHER): Payer: Medicaid Other

## 2020-01-26 ENCOUNTER — Other Ambulatory Visit: Payer: Self-pay

## 2020-01-26 VITALS — BP 110/60 | Wt 188.0 lb

## 2020-01-26 DIAGNOSIS — Z3482 Encounter for supervision of other normal pregnancy, second trimester: Secondary | ICD-10-CM

## 2020-01-26 DIAGNOSIS — Z3A19 19 weeks gestation of pregnancy: Secondary | ICD-10-CM | POA: Diagnosis not present

## 2020-01-26 DIAGNOSIS — Z3A16 16 weeks gestation of pregnancy: Secondary | ICD-10-CM

## 2020-01-26 LAB — POCT URINALYSIS DIPSTICK OB
Glucose, UA: NEGATIVE
POC,PROTEIN,UA: NEGATIVE

## 2020-01-26 NOTE — Progress Notes (Signed)
C/o is vegan vitamins okay to take if you are not a vegan;  Trouble sleeping.rj

## 2020-01-26 NOTE — Progress Notes (Signed)
  Routine Prenatal Care Visit  Subjective  Sabrina Singh is a 19 y.o. G2P0010 at [redacted]w[redacted]d being seen today for ongoing prenatal care.  She is currently monitored for the following issues for this low-risk pregnancy and has Supervision of normal pregnancy on their problem list.  ----------------------------------------------------------------------------------- Patient reports no complaints.  She is happy with her sono pics today. Has named the baby "[place]" using vegan prenates and wants something else to try.  .  .   [name] Fluid denies.  ----------------------------------------------------------------------------------- The following portions of the patient's history were reviewed and updated as appropriate: allergies, current medications, past family history, past medical history, past social history, past surgical history and problem list. Problem list updated.  Objective  Blood pressure 110/60, weight 188 lb (85.3 kg), last menstrual period 09/09/2019, unknown if currently breastfeeding. Pregravid weight 170 lb (77.1 kg) Total Weight Gain 18 lb (8.165 kg) Urinalysis: Urine Protein Negative  Urine Glucose Negative  Fetal Status:           General:  Alert, oriented and cooperative. Patient is in no acute distress.  Skin: Skin is warm and dry. No rash noted.   Cardiovascular: Normal heart rate noted  Respiratory: Normal respiratory effort, no problems with respiration noted  Abdomen: Soft, gravid, appropriate for gestational age.       Pelvic:  Cervical exam deferred        Extremities: Normal range of motion.     Mental Status: Normal mood and affect. Normal behavior. Normal judgment and thought content.   Assessment   19 y.o. G2P0010 at [redacted]w[redacted]d by  06/15/2020, by Last Menstrual Period presenting for routine prenatal visit  Plan   Pregnancy#2 Problems (from 09/09/19 to present)    Problem Noted Resolved   Supervision of normal pregnancy 12/09/2019 by Tresea Mall, CNM No    Overview Addendum 01/05/2020  5:21 PM by [name], CNM    Clinic [facility] Prenatal Labs  Dating LMP=13wk6d [place] Blood type:   A positive  Genetic Screen 1 Screen:    AFP:     Quad:     NIPS: diploid XX Antibody: negative  Anatomic [place] Ordered  Rubella: Immune Varicella: VNI  GTT Early: 42               Third trimester:  RPR: non reactive  Rhogam  HBsAg: neg  Vaccines TDAP:                       Flu Shot: Covid: HIV:  negative  Baby Food undecided                               GBS:   GC/CT: negative/negative  Contraception  Pap: 04/09/19 negative  CBB     CS/VBAC NA   Support Person [name]          Previous Version       Preterm labor symptoms and general obstetric precautions including but not limited to vaginal bleeding, contractions, leaking of fluid and fetal movement were reviewed in detail with the patient. Please refer to After Visit Summary for other counseling recommendations.  We reviewed her sono report.  Samples of prenatal vitamins provided for her to try. No follow-ups on file.  [name], CNM  01/26/2020 3:28 PM

## 2020-02-02 ENCOUNTER — Other Ambulatory Visit: Payer: Self-pay | Admitting: Advanced Practice Midwife

## 2020-02-02 ENCOUNTER — Telehealth: Payer: Self-pay

## 2020-02-02 DIAGNOSIS — Z3482 Encounter for supervision of other normal pregnancy, second trimester: Secondary | ICD-10-CM

## 2020-02-02 MED ORDER — PRENATE PIXIE 10-0.6-0.4-200 MG PO CAPS: 1.0000 | ORAL_CAPSULE | Freq: Every day | ORAL | 8 refills | Status: DC

## 2020-02-02 NOTE — Progress Notes (Signed)
Rx prenate pixie to pharmacy per patient request

## 2020-02-02 NOTE — Telephone Encounter (Signed)
Pt was given samples of prenatal vitamins. She would like a Rx of Prenate Pixie sent to [facility].

## 2020-02-02 NOTE — Telephone Encounter (Signed)
Rx sent as requested- not sure if medicaid will cover

## 2020-02-08 NOTE — Telephone Encounter (Signed)
Patient aware.

## 2020-02-22 ENCOUNTER — Encounter: Payer: Medicaid Other | Admitting: Obstetrics

## 2020-02-26 ENCOUNTER — Other Ambulatory Visit: Payer: Self-pay

## 2020-02-26 ENCOUNTER — Ambulatory Visit (INDEPENDENT_AMBULATORY_CARE_PROVIDER_SITE_OTHER): Payer: Medicaid Other | Admitting: Obstetrics

## 2020-02-26 VITALS — BP 110/50 | Wt 192.0 lb

## 2020-02-26 DIAGNOSIS — Z3482 Encounter for supervision of other normal pregnancy, second trimester: Secondary | ICD-10-CM

## 2020-02-26 DIAGNOSIS — Z3A24 24 weeks gestation of pregnancy: Secondary | ICD-10-CM

## 2020-02-26 LAB — POCT URINALYSIS DIPSTICK OB
Glucose, UA: NEGATIVE
POC,PROTEIN,UA: NEGATIVE

## 2020-02-26 NOTE — Progress Notes (Signed)
  Routine Prenatal Care Visit  Subjective  Sabrina Singh is a 19 y.o. G2P0010 at [redacted]w[redacted]d being seen today for ongoing prenatal care.  She is currently monitored for the following issues for this low-risk pregnancy and has Supervision of normal pregnancy on their problem list.  ----------------------------------------------------------------------------------- Patient reports no complaints.    .  .   Sabrina Singh Fluid denies.  ----------------------------------------------------------------------------------- The following portions of the patient's history were reviewed and updated as appropriate: allergies, current medications, past family history, past medical history, past social history, past surgical history and problem list. Problem list updated.  Objective  Blood pressure (!) 110/50, weight 192 lb (87.1 kg), last menstrual period 09/09/2019, unknown if currently breastfeeding. Pregravid weight 170 lb (77.1 kg) Total Weight Gain 22 lb (9.979 kg) Urinalysis: Urine Protein Negative  Urine Glucose Negative  Fetal Status:           General:  Alert, oriented and cooperative. Patient is in no acute distress.  Skin: Skin is warm and dry. No rash noted.   Cardiovascular: Normal heart rate noted  Respiratory: Normal respiratory effort, no problems with respiration noted  Abdomen: Soft, gravid, appropriate for gestational Singh.       Pelvic:  Cervical exam deferred        Extremities: Normal range of motion.  Edema: None  Mental Status: Normal mood and affect. Normal behavior. Normal judgment and thought content.   Assessment   19 y.o. G2P0010 at [redacted]w[redacted]d by  06/15/2020, by Last Menstrual Period presenting for routine prenatal visit  Plan   Pregnancy#2 Problems (from 09/09/19 to present)    Problem Noted Resolved   Supervision of normal pregnancy 12/09/2019 by Tresea Mall, CNM No   Overview Addendum 02/26/2020 11:45 AM by [name], CNM    Clinic [facility] Prenatal Labs  Dating  LMP=13wk6d [place] Blood type:   A positive  Genetic Screen 1 Screen:    AFP:     Quad:     NIPS: diploid XX Antibody: negative  Anatomic [place] XX Rubella: Immune Varicella: VNI  GTT Early- 99 RPR: non reactive  Rhogam NA HBsAg: neg  Vaccines TDAP:                       Flu Shot: Covid: HIV:  negative  Baby Food undecided                               GBS:   GC/CT: negative/negative  Contraception  Pap: 04/09/19 negative  CBB     CS/VBAC NA   Support Person Sabrina Singh          Previous Version       Preterm labor symptoms and general obstetric precautions including but not limited to vaginal bleeding, contractions, leaking of fluid and fetal movement were reviewed in detail with the patient. Please refer to After Visit Summary for other counseling recommendations.   Return in about 4 weeks (around 03/25/2020) for return OB, 28 week labs.  [name], CNM  02/26/2020 1:17 PM

## 2020-02-26 NOTE — Progress Notes (Signed)
No concerns.rj 

## 2020-03-10 ENCOUNTER — Other Ambulatory Visit: Payer: Self-pay

## 2020-03-10 NOTE — Telephone Encounter (Signed)
Pt called after hour nurse 03/09/20 10:39pm for refill of her prenatal vitamin.  After hour nurse adv pt to call us today.  I have adv pt that she has refills on file at the pharmacy; to contact pharm.

## 2020-03-22 ENCOUNTER — Telehealth: Payer: Self-pay

## 2020-03-22 NOTE — Telephone Encounter (Signed)
FMLA/DISABILITY form for [employer] filled out, signature obtained and given to [facility] for processing.

## 2020-03-25 ENCOUNTER — Ambulatory Visit (INDEPENDENT_AMBULATORY_CARE_PROVIDER_SITE_OTHER): Payer: Medicaid Other | Admitting: Obstetrics and Gynecology

## 2020-03-25 ENCOUNTER — Other Ambulatory Visit: Payer: Self-pay

## 2020-03-25 ENCOUNTER — Other Ambulatory Visit: Payer: Medicaid Other

## 2020-03-25 VITALS — BP 115/70 | Ht <= 58 in | Wt 196.2 lb

## 2020-03-25 DIAGNOSIS — B9689 Other specified bacterial agents as the cause of diseases classified elsewhere: Secondary | ICD-10-CM

## 2020-03-25 DIAGNOSIS — Z3A24 24 weeks gestation of pregnancy: Secondary | ICD-10-CM

## 2020-03-25 DIAGNOSIS — N760 Acute vaginitis: Secondary | ICD-10-CM

## 2020-03-25 DIAGNOSIS — Z3A28 28 weeks gestation of pregnancy: Secondary | ICD-10-CM

## 2020-03-25 DIAGNOSIS — Z3482 Encounter for supervision of other normal pregnancy, second trimester: Secondary | ICD-10-CM | POA: Diagnosis not present

## 2020-03-25 MED ORDER — METRONIDAZOLE 500 MG PO TABS: 500.0000 mg | ORAL_TABLET | Freq: Two times a day (BID) | ORAL | 0 refills | Status: AC

## 2020-03-25 NOTE — Progress Notes (Signed)
    Routine Prenatal Care Visit  Subjective  Sabrina Singh is a 19 y.o. G2P0010 at [redacted]w[redacted]d being seen today for ongoing prenatal care.  She is currently monitored for the following issues for this low-risk pregnancy and has Supervision of normal pregnancy on their problem list.  ----------------------------------------------------------------------------------- Patient reports vaginal dischrage.   Contractions: Not present. Vag. Bleeding: None.  Movement: Present. Denies leaking of fluid.  ----------------------------------------------------------------------------------- The following portions of the patient's history were reviewed and updated as appropriate: allergies, current medications, past family history, past medical history, past social history, past surgical history and problem list. Problem list updated.   Objective  Blood pressure 115/70, height (!) 5.3" (0.135 m), weight 196 lb 3.2 oz (89 kg), last menstrual period 09/09/2019, unknown if currently breastfeeding. Pregravid weight 170 lb (77.1 kg) Total Weight Gain 26 lb 3.2 oz (11.9 kg) Urinalysis:      Fetal Status: Fetal Heart Rate (bpm): 130 Fundal Height: 28 cm Movement: Present     General:  Alert, oriented and cooperative. Patient is in no acute distress.  Skin: Skin is warm and dry. No rash noted.   Cardiovascular: Normal heart rate noted  Respiratory: Normal respiratory effort, no problems with respiration noted  Abdomen: Soft, gravid, appropriate for gestational age. Pain/Pressure: Absent     Pelvic:  Cervical exam deferred        Extremities: Normal range of motion.  Edema: None  Mental Status: Normal mood and affect. Normal behavior. Normal judgment and thought content.     Assessment   19 y.o. G2P0010 at [redacted]w[redacted]d by  06/15/2020, by Last Menstrual Period presenting for routine prenatal visit  Plan   Pregnancy#2 Problems (from 09/09/19 to present)    Problem Noted Resolved   Supervision of normal pregnancy  12/09/2019 by Tresea Mall, CNM No   Overview Addendum 03/25/2020 12:11 PM by Natale Milch, MD    Clinic [facility] Prenatal Labs  Dating LMP=13wk6d [place] Blood type:   A positive  Genetic Screen 1 Screen:    AFP:     Quad:     NIPS: diploid XX Antibody: negative  Anatomic [place] XX Rubella: Immune Varicella: VNI  GTT Early- 99 RPR: non reactive  Rhogam NA HBsAg: neg  Vaccines TDAP:                       Flu Shot:declined Covid:counseled, encouraged- unvaccinated HIV:  negative  Baby Food undecided                               GBS:   GC/CT: negative/negative  Contraception  Pap: 04/09/19 negative  CBB     CS/VBAC NA   Support Person [name]          Previous Version       Wet Prep: Clue Cells: Positive Fungal elements: Negative Trichomonas: Negative  Will treat for bacterial vaginosis- nuswab to confirm Encouraged covid vaccination  Gestational age appropriate obstetric precautions including but not limited to vaginal bleeding, contractions, leaking of fluid and fetal movement were reviewed in detail with the patient.    Return in about 2 weeks (around 04/08/2020) for rob in person .  Natale Milch MD [facility], [facility] 03/25/2020, 12:18 PM

## 2020-03-25 NOTE — Progress Notes (Signed)
OB AND 28 WEEK LABS. Pt states she has some discharge and she would like to be checked.

## 2020-03-25 NOTE — Patient Instructions (Signed)
Third Trimester of Pregnancy The third trimester is from week 28 through week 40 (months 7 through 9). The third trimester is a time when the unborn baby (fetus) is growing rapidly. At the end of the ninth month, the fetus is about 20 inches in length and weighs 6-10 pounds. Body changes during your third trimester Your body will continue to go through many changes during pregnancy. The changes vary from woman to woman. During the third trimester:  Your weight will continue to increase. You can expect to gain 25-35 pounds (11-16 kg) by the end of the pregnancy.  You may begin to get stretch marks on your hips, abdomen, and breasts.  You may urinate more often because the fetus is moving lower into your pelvis and pressing on your bladder.  You may develop or continue to have heartburn. This is caused by increased hormones that slow down muscles in the digestive tract.  You may develop or continue to have constipation because increased hormones slow digestion and cause the muscles that push waste through your intestines to relax.  You may develop hemorrhoids. These are swollen veins (varicose veins) in the rectum that can itch or be painful.  You may develop swollen, bulging veins (varicose veins) in your legs.  You may have increased body aches in the pelvis, back, or thighs. This is due to weight gain and increased hormones that are relaxing your joints.  You may have changes in your hair. These can include thickening of your hair, rapid growth, and changes in texture. Some women also have hair loss during or after pregnancy, or hair that feels dry or thin. Your hair will most likely return to normal after your baby is born.  Your breasts will continue to grow and they will continue to become tender. A yellow fluid (colostrum) may leak from your breasts. This is the first milk you are producing for your baby.  Your belly button may stick out.  You may notice more swelling in your hands,  face, or ankles.  You may have increased tingling or numbness in your hands, arms, and legs. The skin on your belly may also feel numb.  You may feel short of breath because of your expanding uterus.  You may have more problems sleeping. This can be caused by the size of your belly, increased need to urinate, and an increase in your body's metabolism.  You may notice the fetus "dropping," or moving lower in your abdomen (lightening).  You may have increased vaginal discharge.  You may notice your joints feel loose and you may have pain around your pelvic bone. What to expect at prenatal visits You will have prenatal exams every 2 weeks until week 36. Then you will have weekly prenatal exams. During a routine prenatal visit:  You will be weighed to make sure you and the baby are growing normally.  Your blood pressure will be taken.  Your abdomen will be measured to track your baby's growth.  The fetal heartbeat will be listened to.  Any test results from the previous visit will be discussed.  You may have a cervical check near your due date to see if your cervix has softened or thinned (effaced).  You will be tested for Group B streptococcus. This happens between 35 and 37 weeks. Your health care provider may ask you:  What your birth plan is.  How you are feeling.  If you are feeling the baby move.  If you have had any abnormal  symptoms, such as leaking fluid, bleeding, severe headaches, or abdominal cramping.  If you are using any tobacco products, including cigarettes, chewing tobacco, and electronic cigarettes.  If you have any questions. Other tests or screenings that may be performed during your third trimester include:  Blood tests that check for low iron levels (anemia).  Fetal testing to check the health, activity level, and growth of the fetus. Testing is done if you have certain medical conditions or if there are problems during the pregnancy.  Nonstress test  (NST). This test checks the health of your baby to make sure there are no signs of problems, such as the baby not getting enough oxygen. During this test, a belt is placed around your belly. The baby is made to move, and its heart rate is monitored during movement. What is false labor? False labor is a condition in which you feel small, irregular tightenings of the muscles in the womb (contractions) that usually go away with rest, changing position, or drinking water. These are called Braxton Hicks contractions. Contractions may last for hours, days, or even weeks before true labor sets in. If contractions come at regular intervals, become more frequent, increase in intensity, or become painful, you should see your health care provider. What are the signs of labor?  Abdominal cramps.  Regular contractions that start at 10 minutes apart and become stronger and more frequent with time.  Contractions that start on the top of the uterus and spread down to the lower abdomen and back.  Increased pelvic pressure and dull back pain.  A watery or bloody mucus discharge that comes from the vagina.  Leaking of amniotic fluid. This is also known as your "water breaking." It could be a slow trickle or a gush. Let your health care provider know if it has a color or strange odor. If you have any of these signs, call your health care provider right away, even if it is before your due date. Follow these instructions at home: Medicines  Follow your health care provider's instructions regarding medicine use. Specific medicines may be either safe or unsafe to take during pregnancy.  Take a prenatal vitamin that contains at least 600 micrograms (mcg) of folic acid.  If you develop constipation, try taking a stool softener if your health care provider approves. Eating and drinking   Eat a balanced diet that includes fresh fruits and vegetables, whole grains, good sources of protein such as meat, eggs, or tofu,  and low-fat dairy. Your health care provider will help you determine the amount of weight gain that is right for you.  Avoid raw meat and uncooked cheese. These carry germs that can cause birth defects in the baby.  If you have low calcium intake from food, talk to your health care provider about whether you should take a daily calcium supplement.  Eat four or five small meals rather than three large meals a day.  Limit foods that are high in fat and processed sugars, such as fried and sweet foods.  To prevent constipation: ? Drink enough fluid to keep your urine clear or pale yellow. ? Eat foods that are high in fiber, such as fresh fruits and vegetables, whole grains, and beans. Activity  Exercise only as directed by your health care provider. Most women can continue their usual exercise routine during pregnancy. Try to exercise for 30 minutes at least 5 days a week. Stop exercising if you experience uterine contractions.  Avoid heavy lifting.  Do  not exercise in extreme heat or humidity, or at high altitudes.  Wear low-heel, comfortable shoes.  Practice good posture.  You may continue to have sex unless your health care provider tells you otherwise. Relieving pain and discomfort  Take frequent breaks and rest with your legs elevated if you have leg cramps or low back pain.  Take warm sitz baths to soothe any pain or discomfort caused by hemorrhoids. Use hemorrhoid cream if your health care provider approves.  Wear a good support bra to prevent discomfort from breast tenderness.  If you develop varicose veins: ? Wear support pantyhose or compression stockings as told by your healthcare provider. ? Elevate your feet for 15 minutes, 3-4 times a day. Prenatal care  Write down your questions. Take them to your prenatal visits.  Keep all your prenatal visits as told by your health care provider. This is important. Safety  Wear your seat belt at all times when driving.  Make  a list of emergency phone numbers, including numbers for family, friends, the hospital, and police and fire departments. General instructions  Avoid cat litter boxes and soil used by cats. These carry germs that can cause birth defects in the baby. If you have a cat, ask someone to clean the litter box for you.  Do not travel far distances unless it is absolutely necessary and only with the approval of your health care provider.  Do not use hot tubs, steam rooms, or saunas.  Do not drink alcohol.  Do not use any products that contain nicotine or tobacco, such as cigarettes and e-cigarettes. If you need help quitting, ask your health care provider.  Do not use any medicinal herbs or unprescribed drugs. These chemicals affect the formation and growth of the baby.  Do not douche or use tampons or scented sanitary pads.  Do not cross your legs for long periods of time.  To prepare for the arrival of your baby: ? Take prenatal classes to understand, practice, and ask questions about labor and delivery. ? Make a trial run to the hospital. ? Visit the hospital and tour the maternity area. ? Arrange for maternity or paternity leave through employers. ? Arrange for family and friends to take care of pets while you are in the hospital. ? Purchase a rear-facing car seat and make sure you know how to install it in your car. ? Pack your hospital bag. ? Prepare the baby's nursery. Make sure to remove all pillows and stuffed animals from the baby's crib to prevent suffocation.  Visit your dentist if you have not gone during your pregnancy. Use a soft toothbrush to brush your teeth and be gentle when you floss. Contact a health care provider if:  You are unsure if you are in labor or if your water has broken.  You become dizzy.  You have mild pelvic cramps, pelvic pressure, or nagging pain in your abdominal area.  You have lower back pain.  You have persistent nausea, vomiting, or  diarrhea.  You have an unusual or bad smelling vaginal discharge.  You have pain when you urinate. Get help right away if:  Your water breaks before 37 weeks.  You have regular contractions less than 5 minutes apart before 37 weeks.  You have a fever.  You are leaking fluid from your vagina.  You have spotting or bleeding from your vagina.  You have severe abdominal pain or cramping.  You have rapid weight loss or weight gain.  You have  shortness of breath with chest pain.  You notice sudden or extreme swelling of your face, hands, ankles, feet, or legs.  Your baby makes fewer than 10 movements in 2 hours.  You have severe headaches that do not go away when you take medicine.  You have vision changes. Summary  The third trimester is from week 28 through week 40, months 7 through 9. The third trimester is a time when the unborn baby (fetus) is growing rapidly.  During the third trimester, your discomfort may increase as you and your baby continue to gain weight. You may have abdominal, leg, and back pain, sleeping problems, and an increased need to urinate.  During the third trimester your breasts will keep growing and they will continue to become tender. A yellow fluid (colostrum) may leak from your breasts. This is the first milk you are producing for your baby.  False labor is a condition in which you feel small, irregular tightenings of the muscles in the womb (contractions) that eventually go away. These are called Braxton Hicks contractions. Contractions may last for hours, days, or even weeks before true labor sets in.  Signs of labor can include: abdominal cramps; regular contractions that start at 10 minutes apart and become stronger and more frequent with time; watery or bloody mucus discharge that comes from the vagina; increased pelvic pressure and dull back pain; and leaking of amniotic fluid. This information is not intended to replace advice given to you by your  health care provider. Make sure you discuss any questions you have with your health care provider. Document Revised: 08/14/2018 Document Reviewed: 05/29/2016 Elsevier Patient Education  [place].  Pregnancy and Vaccinations Vaccines can help to keep you healthy. There are some vaccines that should be given before pregnancy and some that should be given during pregnancy. How does this affect me? If you are pregnant or thinking about getting pregnant, talk with your health care provider about what vaccines are right for you. How does this affect my baby? Usually, the benefits of receiving vaccines during pregnancy outweigh the risks of harm to you or your baby if:  The risk of being exposed to a disease is high.  Infection would pose a risk to you or your unborn baby.  The vaccine is not likely to cause harm. Vaccines can help protect your baby from some diseases until he or she is old enough to get the vaccine. What can I do to lower my risk? When you receive the recommended vaccines, it helps to protect you from getting certain diseases and passing them on to your baby. Should I receive vaccines before pregnancy? If possible, make sure that your vaccines are up to date before you become pregnant.  It is safe and important for you to receive weakened viral and weakened bacterial vaccines (inactivated vaccines) as needed before you are pregnant.  Live viral and live bacterial vaccines, such as the measles, mumps, and rubella (MMR) vaccine, should be given 1 month or more before pregnancy. Sometimes, women become pregnant within 1 month of receiving a live vaccine that is not usually recommended during pregnancy. The [place].S. Centers for Disease Control and Prevention (CDC) has reported that when this has happened, vaccines have not harmed pregnant women or their unborn babies. Should I receive vaccines during pregnancy?  It is safe and important for you to receive some inactivated  vaccines as needed during pregnancy. Until your baby can receive vaccines, your baby will get some protection from  diseases through the vaccines that you receive while you are pregnant. During your pregnancy, you should receive the following:  Influenza vaccine (the flu shot). ? The flu shot pregnancy.  Tetanus, diphtheria, and pertussis (Tdap) vaccine. ? The Tdap vaccine will help to prevent whooping cough (pertussis) in you and your baby. ? You should receive 1 dose of this vaccine during each pregnancy. It is recommended that pregnant women receive this vaccine between 27 and 36 weeks of pregnancy. Should I receive vaccines after pregnancy?  It is safe and important for you to receive vaccines as needed after pregnancy. Some are safe to have if you are breastfeeding. Other vaccines may not be safe to have until after you have stopped breastfeeding.  If you did not receive the Tdap vaccine during your pregnancy and have never received a Tdap vaccine, you should receive that vaccine right after you give birth to your baby (delivery).  If you are not immune to measles, mumps, rubella, or chickenpox (varicella), you should receive those vaccines within days after delivery.  It is important to talk with your health care provider about what vaccines you may need after delivery. What if I am pregnant and I plan to travel internationally? If you are pregnant and you are planning to travel internationally, talk with your health care provider at least 4-6 weeks before your trip. Depending on the country you are planning to visit, you may need to take special precautions or get certain vaccines to prevent disease. Vaccines that may be recommended for pregnant international travelers include:  Influenza (the flu shot).  Tetanus and diphtheria (Td) or Tdap.  Hepatitis B (HepB).  Hepatitis A (HepA). Your health care provider can help you decide if you need vaccines and if the benefits outweigh the  risk of disease exposure. Follow these instructions at home:  Take over-the-counter and prescription medicines as told by your health care provider.  Keep all follow-up visits as told by your health care provider. This is important. Questions to ask your health care provider:  What vaccines are safe during pregnancy?  What are the risks of vaccines during pregnancy?  What are the potential side effects of vaccines during or after pregnancy?  When should I get vaccines during pregnancy? Contact a health care provider if you:  Believe you have had a reaction to a vaccine.  Have concerns or questions about a vaccine.  Become pregnant within 1 month after you have received a live vaccine. Summary  Vaccines are the most effective way to prevent certain diseases.  Many vaccines are safe to receive during pregnancy.  Some vaccines are recommended during pregnancy to protect you and your baby from getting sick.  If you are pregnant or planning to become pregnant, talk with your health care provider about what vaccines are right for you. This information is not intended to replace advice given to you by your health care provider. Make sure you discuss any questions you have with your health care provider. Document Revised: 10/07/2018 Document Reviewed: 05/28/2018 Elsevier Patient Education  [place].

## 2020-03-26 LAB — 28 WEEK RH+PANEL
Basophils Absolute: 0 10*3/uL (ref 0.0–0.2)
Basos: 0 %
EOS (ABSOLUTE): 0 10*3/uL (ref 0.0–0.4)
Eos: 0 %
Gestational Diabetes Screen: 101 mg/dL (ref 65–139)
HIV Screen 4th Generation wRfx: NONREACTIVE
Hematocrit: 30.8 % — ABNORMAL LOW (ref 34.0–46.6)
Hemoglobin: 10.2 g/dL — ABNORMAL LOW (ref 11.1–15.9)
Immature Grans (Abs): 0.1 10*3/uL (ref 0.0–0.1)
Immature Granulocytes: 1 %
Lymphocytes Absolute: 1.9 10*3/uL (ref 0.7–3.1)
Lymphs: 19 %
MCH: 27.6 pg (ref 26.6–33.0)
MCHC: 33.1 g/dL (ref 31.5–35.7)
MCV: 83 fL (ref 79–97)
Monocytes Absolute: 1 10*3/uL — ABNORMAL HIGH (ref 0.1–0.9)
Monocytes: 10 %
Neutrophils Absolute: 7.1 10*3/uL — ABNORMAL HIGH (ref 1.4–7.0)
Neutrophils: 70 %
Platelets: 242 10*3/uL (ref 150–450)
RBC: 3.7 x10E6/uL — ABNORMAL LOW (ref 3.77–5.28)
RDW: 13 % (ref 11.7–15.4)
RPR Ser Ql: NONREACTIVE
WBC: 10.1 10*3/uL (ref 3.4–10.8)

## 2020-03-29 LAB — NUSWAB VAGINITIS PLUS (VG+)
Atopobium vaginae: HIGH Score — AB
Candida albicans, NAA: NEGATIVE
Candida glabrata, NAA: NEGATIVE
Chlamydia trachomatis, NAA: NEGATIVE
Megasphaera 1: HIGH Score — AB
Neisseria gonorrhoeae, NAA: NEGATIVE
Trich vag by NAA: NEGATIVE

## 2020-03-30 ENCOUNTER — Encounter: Payer: Self-pay | Admitting: Obstetrics

## 2020-04-07 ENCOUNTER — Ambulatory Visit (INDEPENDENT_AMBULATORY_CARE_PROVIDER_SITE_OTHER): Payer: Medicaid Other | Admitting: Obstetrics and Gynecology

## 2020-04-07 ENCOUNTER — Other Ambulatory Visit: Payer: Self-pay

## 2020-04-07 VITALS — BP 113/69 | Wt 198.0 lb

## 2020-04-07 DIAGNOSIS — Z3482 Encounter for supervision of other normal pregnancy, second trimester: Secondary | ICD-10-CM

## 2020-04-07 DIAGNOSIS — Z3A30 30 weeks gestation of pregnancy: Secondary | ICD-10-CM

## 2020-04-07 DIAGNOSIS — N898 Other specified noninflammatory disorders of vagina: Secondary | ICD-10-CM

## 2020-04-07 DIAGNOSIS — O26893 Other specified pregnancy related conditions, third trimester: Secondary | ICD-10-CM

## 2020-04-07 NOTE — Progress Notes (Signed)
x 4 days, was treated with flagyl for BV and now leaking fluids. Procedure RM

## 2020-04-07 NOTE — Progress Notes (Signed)
    Routine Prenatal Care Visit  Subjective  Sabrina Singh is a 19 y.o. G2P0010 at [redacted]w[redacted]d being seen today for ongoing prenatal care.  She is currently monitored for the following issues for this low-risk pregnancy and has Supervision of normal pregnancy on their problem list.  ----------------------------------------------------------------------------------- Patient reports increased vaginal discharge Contractions: Not present. Vag. Bleeding: None.  Movement: Present. Denies leaking of fluid.  ----------------------------------------------------------------------------------- The following portions of the patient's history were reviewed and updated as appropriate: allergies, current medications, past family history, past medical history, past social history, past surgical history and problem list. Problem list updated.   Objective  Last menstrual period 09/09/2019, unknown if currently breastfeeding. Pregravid weight 170 lb (77.1 kg) Total Weight Gain 28 lb (12.7 kg) Urinalysis:      Fetal Status: Fetal Heart Rate (bpm): 150   Movement: Present     General:  Alert, oriented and cooperative. Patient is in no acute distress.  Skin: Skin is warm and dry. No rash noted.   Cardiovascular: Normal heart rate noted  Respiratory: Normal respiratory effort, no problems with respiration noted  Abdomen: Soft, gravid, appropriate for gestational age. Pain/Pressure: Absent     Pelvic:  Cervical exam deferred        Extremities: Normal range of motion.     ental Status: Normal mood and affect. Normal behavior. Normal judgment and thought content.   Negative Nitrazine, ferning, and pooling  Assessment   19 y.o. G2P0010 at [redacted]w[redacted]d by  06/15/2020, by Last Menstrual Period presenting for work-in prenatal visit  Plan   Pregnancy#2 Problems (from 09/09/19 to present)    Problem Noted Resolved   Supervision of normal pregnancy 12/09/2019 by Tresea Mall, CNM No   Overview Addendum 03/30/2020  8:38 AM  by [name], CNM    [facility] Prenatal Labs  Dating LMP=[id number] [place] Blood type:   A positive  Genetic Screen 1 Screen:    AFP:     Quad:     NIPS: diploid XX Antibody: negative  Anatomic [place] XX Rubella: Immune Varicella: VNI  GTT Early- 99,   101 RPR: non reactive  Rhogam NA HBsAg: neg  Vaccines TDAP:                       Flu Shot:declined Covid:counseled, encouraged- unvaccinated HIV:  negative  Baby Food undecided                               GBS:   GC/CT: negative/negative  Contraception  Pap: 04/09/19 negative  CBB     CS/VBAC NA   Support Person [name]          Previous Version       Gestational age appropriate obstetric precautions including but not limited to vaginal bleeding, contractions, leaking of fluid and fetal movement were reviewed in detail with the patient.    - GC/CT negative 03/25/2020 - Negative pooling, ferning, nitrazine  Return if symptoms worsen or fail to improve, otherwise keep previously scheduled ROB.  Vena Austria, MD, [facility], [facility] 04/07/2020, 4:02 PM

## 2020-04-08 ENCOUNTER — Encounter: Payer: Medicaid Other | Admitting: Advanced Practice Midwife

## 2020-04-11 ENCOUNTER — Telehealth: Payer: Self-pay

## 2020-04-11 ENCOUNTER — Other Ambulatory Visit: Payer: Self-pay

## 2020-04-11 ENCOUNTER — Ambulatory Visit (INDEPENDENT_AMBULATORY_CARE_PROVIDER_SITE_OTHER): Payer: Medicaid Other | Admitting: Obstetrics

## 2020-04-11 VITALS — BP 110/60 | Wt 201.0 lb

## 2020-04-11 DIAGNOSIS — B3731 Acute candidiasis of vulva and vagina: Secondary | ICD-10-CM

## 2020-04-11 DIAGNOSIS — Z3A30 30 weeks gestation of pregnancy: Secondary | ICD-10-CM

## 2020-04-11 DIAGNOSIS — Z23 Encounter for immunization: Secondary | ICD-10-CM | POA: Diagnosis not present

## 2020-04-11 DIAGNOSIS — O26893 Other specified pregnancy related conditions, third trimester: Secondary | ICD-10-CM

## 2020-04-11 DIAGNOSIS — B373 Candidiasis of vulva and vagina: Secondary | ICD-10-CM

## 2020-04-11 DIAGNOSIS — Z3403 Encounter for supervision of normal first pregnancy, third trimester: Secondary | ICD-10-CM

## 2020-04-11 DIAGNOSIS — N898 Other specified noninflammatory disorders of vagina: Secondary | ICD-10-CM

## 2020-04-11 LAB — POCT URINALYSIS DIPSTICK OB: Glucose, UA: NEGATIVE

## 2020-04-11 MED ORDER — FLUCONAZOLE 150 MG PO TABS: 150.0000 mg | ORAL_TABLET | Freq: Once | ORAL | 0 refills | Status: AC

## 2020-04-11 NOTE — Progress Notes (Signed)
  Routine Prenatal Care Visit  Subjective  Sabrina Singh is a 19 y.o. G2P0010 at [redacted]w[redacted]d being seen today for ongoing prenatal care.  She is currently monitored for the following issues for this low-risk pregnancy and has Supervision of normal pregnancy on their problem list.  ----------------------------------------------------------------------------------- Patient reports vaginal irritation.  Since taking meds fore BV, she has developed a clumpy white vaginal discharge. Very uncomfortable.  .  .   [name] Fluid denies.  ----------------------------------------------------------------------------------- The following portions of the patient's history were reviewed and updated as appropriate: allergies, current medications, past family history, past medical history, past social history, past surgical history and problem list. Problem list updated.  Objective  Blood pressure 110/60, weight 201 lb (91.2 kg), last menstrual period 09/09/2019, unknown if currently breastfeeding. Pregravid weight 170 lb (77.1 kg) Total Weight Gain 31 lb (14.1 kg) Urinalysis: Urine Protein Trace  Urine Glucose Negative  Fetal Status:           General:  Alert, oriented and cooperative. Patient is in no acute distress.  Skin: Skin is warm and dry. No rash noted.   Cardiovascular: Normal heart rate noted  Respiratory: Normal respiratory effort, no problems with respiration noted  Abdomen: Soft, gravid, appropriate for gestational age.       Pelvic:  Cervical exam deferred        Extremities: Normal range of motion.     Mental Status: Normal mood and affect. Normal behavior. Normal judgment and thought content.   Spec exam: heavy thick white discharge coating entire cervix and vaginal vault. Assessment   19 y.o. G2P0010 at [redacted]w[redacted]d by  06/15/2020, by Last Menstrual Period presenting for routine prenatal visit  Plan   Pregnancy#2 Problems (from 09/09/19 to present)    Problem Noted Resolved   Supervision of  normal pregnancy 12/09/2019 by Tresea Mall, CNM No   Overview Addendum 03/30/2020  8:38 AM by [name], CNM    [facility] Prenatal Labs  Dating LMP=[id number] [place] Blood type:   A positive  Genetic Screen 1 Screen:    AFP:     Quad:     NIPS: diploid XX Antibody: negative  Anatomic [place] XX Rubella: Immune Varicella: VNI  GTT Early- 99,   101 RPR: non reactive  Rhogam NA HBsAg: neg  Vaccines TDAP:                       Flu Shot:declined Covid:counseled, encouraged- unvaccinated HIV:  negative  Baby Food undecided                               GBS:   GC/CT: negative/negative  Contraception  Pap: 04/09/19 negative  CBB     CS/VBAC NA   Support Person [name]          Previous Version       Preterm labor symptoms and general obstetric precautions including but not limited to vaginal bleeding, contractions, leaking of fluid and fetal movement were reviewed in detail with the patient. Please refer to After Visit Summary for other counseling recommendations.   Yeast vaginitis. Rx for Diflucan prescribed and sent. She will also treat with an OTC cream. Educated on yeast and how to treat it. Remnded to add an iron tab to her vitamins   No follow-ups on file.  [name], CNM  04/11/2020 10:05 AM

## 2020-04-11 NOTE — Addendum Note (Signed)
Addended by: Loran Senters D on: 04/11/2020 11:08 AM   Modules accepted: Orders

## 2020-04-11 NOTE — Progress Notes (Signed)
C/o yeast inf - thick d/c, itches really bad, cottage cheese looking. rj

## 2020-04-11 NOTE — Telephone Encounter (Signed)
Lm with pt. FMLA has been filled out. We called her to have her pay for $25 fee and fill out forms. Please let her know when she calls back. Her FMLA is up front.

## 2020-04-25 ENCOUNTER — Other Ambulatory Visit: Payer: Self-pay

## 2020-04-25 ENCOUNTER — Ambulatory Visit (INDEPENDENT_AMBULATORY_CARE_PROVIDER_SITE_OTHER): Payer: Medicaid Other | Admitting: Obstetrics

## 2020-04-25 VITALS — Wt 203.0 lb

## 2020-04-25 DIAGNOSIS — Z3A32 32 weeks gestation of pregnancy: Secondary | ICD-10-CM

## 2020-04-25 DIAGNOSIS — Z3403 Encounter for supervision of normal first pregnancy, third trimester: Secondary | ICD-10-CM

## 2020-04-25 DIAGNOSIS — B373 Candidiasis of vulva and vagina: Secondary | ICD-10-CM

## 2020-04-25 DIAGNOSIS — B3731 Acute candidiasis of vulva and vagina: Secondary | ICD-10-CM

## 2020-04-25 LAB — POCT URINALYSIS DIPSTICK OB
Glucose, UA: NEGATIVE
POC,PROTEIN,UA: NEGATIVE

## 2020-04-25 MED ORDER — FLUCONAZOLE 150 MG PO TABS: 150.0000 mg | ORAL_TABLET | Freq: Once | ORAL | 1 refills | Status: AC

## 2020-04-25 NOTE — Progress Notes (Signed)
C/o something is still going on down there - itches, burns, something is leaking.rj

## 2020-04-25 NOTE — Progress Notes (Signed)
  Routine Prenatal Care Visit  Subjective  Sabrina Singh is a 19 y.o. G2P0010 at [redacted]w[redacted]d being seen today for ongoing prenatal care.  She is currently monitored for the following issues for this low-risk pregnancy and has Supervision of normal pregnancy on their problem list.  ----------------------------------------------------------------------------------- Patient reports no contractions, no cramping, vaginal irritation and and continues to c/o ithicng and burning. She took one Diflucan, and did a 3 day yeast treatment. Still having sxs..    .  .   . Leaking Fluid denies.  ----------------------------------------------------------------------------------- The following portions of the patient's history were reviewed and updated as appropriate: allergies, current medications, past family history, past medical history, past social history, past surgical history and problem list. Problem list updated.  Objective  Weight 203 lb (92.1 kg), last menstrual period 09/09/2019, unknown if currently breastfeeding. Pregravid weight 170 lb (77.1 kg) Total Weight Gain 33 lb (15 kg) Urinalysis: Urine Protein Negative  Urine Glucose Negative  Fetal Status:           General:  Alert, oriented and cooperative. Patient is in no acute distress.  Skin: Skin is warm and dry. No rash noted.   Cardiovascular: Normal heart rate noted  Respiratory: Normal respiratory effort, no problems with respiration noted  Abdomen: Soft, gravid, appropriate for gestational age.       Pelvic:  Cervical exam deferred        Extremities: Normal range of motion.     Mental Status: Normal mood and affect. Normal behavior. Normal judgment and thought content.   Assessment   19 y.o. G2P0010 at [redacted]w[redacted]d by  06/15/2020, by Last Menstrual Period presenting for routine prenatal visit  Plan   Pregnancy#2 Problems (from 09/09/19 to present)    Problem Noted Resolved   Supervision of normal pregnancy 12/09/2019 by Tresea Mall, CNM No    Overview Addendum 03/30/2020  8:38 AM by [name], CNM    [facility] Prenatal Labs  Dating LMP=13wk6d [place] Blood type:   A positive  Genetic Screen 1 Screen:    AFP:     Quad:     NIPS: diploid XX Antibody: negative  Anatomic [place] XX Rubella: Immune Varicella: VNI  GTT Early- 99,   101 RPR: non reactive  Rhogam NA HBsAg: neg  Vaccines TDAP:                       Flu Shot:declined Covid:counseled, encouraged- unvaccinated HIV:  negative  Baby Food undecided                               GBS:   GC/CT: negative/negative  Contraception  Pap: 04/09/19 negative  CBB     CS/VBAC NA   Support Person [name]          Previous Version       Preterm labor symptoms and general obstetric precautions including but not limited to vaginal bleeding, contractions, leaking of fluid and fetal movement were reviewed in detail with the patient. Please refer to After Visit Summary for other counseling recommendations.  Wet mount done- + hyphae and buds noted. Will renew her diflucan and she will ad OTC cream.  Return in about 2 weeks (around 05/09/2020) for return OB.  [name], CNM  04/25/2020 4:45 PM

## 2020-05-07 NOTE — L&D Delivery Note (Addendum)
Delivery Note At 10:24 PM a viable female was delivered via Vaginal, Spontaneous (Presentation: Left Occiput Anterior).  APGAR: 8, 9; weight pending.   Placenta status: Spontaneous, Intact. Cord: 3 vessels with the following complications: true knot.  Cord pH: n/a  Anesthesia: Epidural Episiotomy: None Lacerations: Labial;1st degree Suture Repair: 4-0 Vicryl Est. Blood Loss (mL):  400, QBL 480 mL  Mom to postpartum.  Baby to Couplet care / Skin to Skin.  Called to see patient.  Mom pushed to deliver a viable female infant.  The head followed by shoulders, which delivered without difficulty, and the rest of the body.  No nuchal cord noted.  A true knot was noted.  Baby to mom's chest.  Cord clamped and cut after > 1 min delay.  No cord blood obtained.  Placenta delivered spontaneously, intact, with a 3-vessel cord.  First degree right labium minus laceration repaired with 4-0 Vicryl in standard fashion.  All counts correct.  Hemostasis obtained with IV pitocin and fundal massage. EBL 400 mL.   QBL 480 mL.  Thomasene Mohair, MD 06/09/2020, 10:53 PM

## 2020-05-11 ENCOUNTER — Ambulatory Visit (INDEPENDENT_AMBULATORY_CARE_PROVIDER_SITE_OTHER): Payer: Medicaid Other | Admitting: Obstetrics

## 2020-05-11 ENCOUNTER — Other Ambulatory Visit: Payer: Self-pay

## 2020-05-11 VITALS — BP 112/70 | Wt 206.8 lb

## 2020-05-11 DIAGNOSIS — Z3A35 35 weeks gestation of pregnancy: Secondary | ICD-10-CM

## 2020-05-11 DIAGNOSIS — Z3403 Encounter for supervision of normal first pregnancy, third trimester: Secondary | ICD-10-CM

## 2020-05-11 NOTE — Progress Notes (Signed)
ROB 35

## 2020-05-11 NOTE — Progress Notes (Signed)
  Routine Prenatal Care Visit  Subjective  Sabrina Singh is a 20 y.o. G2P0010 at [redacted]w[redacted]d being seen today for ongoing prenatal care.  She is currently monitored for the following issues for this low-risk pregnancy and has Supervision of normal pregnancy on their problem list.  ----------------------------------------------------------------------------------- Patient reports no complaints.   Contractions: Irritability. Vag. Bleeding: None.  Movement: Present. Leaking Fluid denies.  ----------------------------------------------------------------------------------- The following portions of the patient's history were reviewed and updated as appropriate: allergies, current medications, past family history, past medical history, past social history, past surgical history and problem list. Problem list updated.  Objective  Blood pressure 112/70, weight 206 lb 12.8 oz (93.8 kg), last menstrual period 09/09/2019, unknown if currently breastfeeding. Pregravid weight 170 lb (77.1 kg) Total Weight Gain 36 lb 12.8 oz (16.7 kg) Urinalysis: Urine Protein    Urine Glucose    Fetal Status:     Movement: Present     General:  Alert, oriented and cooperative. Patient is in no acute distress.  Skin: Skin is warm and dry. No rash noted.   Cardiovascular: Normal heart rate noted  Respiratory: Normal respiratory effort, no problems with respiration noted  Abdomen: Soft, gravid, appropriate for gestational age. Pain/Pressure: Present     Pelvic:  Cervical exam deferred        Extremities: Normal range of motion.     Mental Status: Normal mood and affect. Normal behavior. Normal judgment and thought content.   Assessment   20 y.o. G2P0010 at [redacted]w[redacted]d by  06/15/2020, by Last Menstrual Period presenting for routine prenatal visit  Plan   Pregnancy#2 Problems (from 09/09/19 to present)    Problem Noted Resolved   Supervision of normal pregnancy 12/09/2019 by Tresea Mall, CNM No   Overview Addendum 03/30/2020   8:38 AM by [name], CNM    [facility] Prenatal Labs  Dating LMP=13wk6d [place] Blood type:   A positive  Genetic Screen 1 Screen:    AFP:     Quad:     NIPS: diploid XX Antibody: negative  Anatomic [place] XX Rubella: Immune Varicella: VNI  GTT Early- 99,   101 RPR: non reactive  Rhogam NA HBsAg: neg  Vaccines TDAP:                       Flu Shot:declined Covid:counseled, encouraged- unvaccinated HIV:  negative  Baby Food undecided                               GBS:   GC/CT: negative/negative  Contraception  Pap: 04/09/19 negative  CBB     CS/VBAC NA   Support Person [name]          Previous Version       Term labor symptoms and general obstetric precautions including but not limited to vaginal bleeding, contractions, leaking of fluid and fetal movement were reviewed in detail with the patient. Please refer to After Visit Summary for other counseling recommendations.   Return in about 1 week (around 05/18/2020) for return OB.  [name], CNM  05/11/2020 10:10 AM

## 2020-05-12 ENCOUNTER — Encounter: Payer: Medicaid Other | Admitting: Advanced Practice Midwife

## 2020-05-13 ENCOUNTER — Encounter: Payer: Medicaid Other | Admitting: Obstetrics

## 2020-05-20 ENCOUNTER — Other Ambulatory Visit: Payer: Self-pay

## 2020-05-20 ENCOUNTER — Ambulatory Visit (INDEPENDENT_AMBULATORY_CARE_PROVIDER_SITE_OTHER): Payer: Medicaid Other | Admitting: Obstetrics

## 2020-05-20 VITALS — BP 120/70 | Ht 63.0 in | Wt 210.4 lb

## 2020-05-20 DIAGNOSIS — Z3403 Encounter for supervision of normal first pregnancy, third trimester: Secondary | ICD-10-CM

## 2020-05-20 NOTE — Progress Notes (Signed)
  Routine Prenatal Care Visit  Subjective  Sabrina Singh is a 20 y.o. G2P0010 at [redacted]w[redacted]d being seen today for ongoing prenatal care.  She is currently monitored for the following issues for this low-risk pregnancy and has Supervision of normal pregnancy on their problem list.  ----------------------------------------------------------------------------------- Patient reports no complaints.   Contractions: Irritability.  .  Movement: Present. Leaking Fluid denies.  ----------------------------------------------------------------------------------- The following portions of the patient's history were reviewed and updated as appropriate: allergies, current medications, past family history, past medical history, past social history, past surgical history and problem list. Problem list updated.  Objective  Blood pressure 120/70, height 5\' 3"  (1.6 m), weight 210 lb 6.4 oz (95.4 kg), last menstrual period 09/09/2019, unknown if currently breastfeeding. Pregravid weight 170 lb (77.1 kg) Total Weight Gain 40 lb 6.4 oz (18.3 kg) Urinalysis: Urine Protein    Urine Glucose    Fetal Status:     Movement: Present     General:  Alert, oriented and cooperative. Patient is in no acute distress.  Skin: Skin is warm and dry. No rash noted.   Cardiovascular: Normal heart rate noted  Respiratory: Normal respiratory effort, no problems with respiration noted  Abdomen: Soft, gravid, appropriate for gestational age. Pain/Pressure: Present     Pelvic:  Cervical exam deferred        Extremities: Normal range of motion.     Mental Status: Normal mood and affect. Normal behavior. Normal judgment and thought content.   Assessment   20 y.o. G2P0010 at [redacted]w[redacted]d by  06/15/2020, by Last Menstrual Period presenting for routine prenatal visit  Plan   Pregnancy#2 Problems (from 09/09/19 to present)    Problem Noted Resolved   Supervision of normal pregnancy 12/09/2019 by 02/08/2020, CNM No   Overview Addendum 05/20/2020  10:18 AM by 05/22/2020, CNM    [facility] Prenatal Labs  Dating LMP=13wk6d Sabrina Singh Blood type:   A positive  Genetic Screen 1 Screen:    AFP:     Quad:     NIPS: diploid XX Antibody: negative  Anatomic [place] XX Rubella: Immune Varicella: VNI  GTT Early- 99,   101 RPR: non reactive  Rhogam NA HBsAg: neg  Vaccines TDAP:                       Flu Shot:declined Covid:counseled, encouraged- unvaccinated HIV:  negative  Baby Food undecided                               GBS:  05/20/20 GC/CT: negative/negative  Contraception  Pap: 04/09/19 negative  CBB     CS/VBAC NA   Support Person 14/3/20          Previous Version       Term labor symptoms and general obstetric precautions including but not limited to vaginal bleeding, contractions, leaking of fluid and fetal movement were reviewed in detail with the patient. Please refer to After Visit Summary for other counseling recommendations.  GBS culture retrieved. VE: closed/30% effaced/ballotable.  Return in about 1 week (around 05/27/2020) for return OB.  05/29/2020, CNM  05/20/2020 10:20 AM

## 2020-05-20 NOTE — Progress Notes (Signed)
ROB 36w

## 2020-05-24 LAB — CULTURE, BETA STREP (GROUP B ONLY): Strep Gp B Culture: POSITIVE — AB

## 2020-05-25 ENCOUNTER — Ambulatory Visit (INDEPENDENT_AMBULATORY_CARE_PROVIDER_SITE_OTHER): Payer: Medicaid Other | Admitting: Advanced Practice Midwife

## 2020-05-25 ENCOUNTER — Encounter: Payer: Medicaid Other | Admitting: Obstetrics and Gynecology

## 2020-05-25 ENCOUNTER — Other Ambulatory Visit: Payer: Self-pay

## 2020-05-25 ENCOUNTER — Encounter: Payer: Self-pay | Admitting: Advanced Practice Midwife

## 2020-05-25 VITALS — BP 118/78 | Wt 201.0 lb

## 2020-05-25 DIAGNOSIS — Z3483 Encounter for supervision of other normal pregnancy, third trimester: Secondary | ICD-10-CM

## 2020-05-25 DIAGNOSIS — Z3A37 37 weeks gestation of pregnancy: Secondary | ICD-10-CM

## 2020-05-25 NOTE — Patient Instructions (Signed)
Group B Streptococcus Infection During Pregnancy Group B Streptococcus (GBS) is a type of bacteria that is often found in healthy people. It is commonly found in the rectum, vagina, and intestines. In people who are healthy and not pregnant, the bacteria rarely cause serious illness or complications. However, women who test positive for GBS during pregnancy can pass the bacteria to the baby during childbirth. This can cause serious infection in the baby after birth. Women with GBS may also have infections during their pregnancy or soon after childbirth. The infections include urinary tract infections (UTIs) or infections of the uterus. GBS also increases a woman's risk of complications during pregnancy, such as early labor or delivery, miscarriage, or stillbirth. Routine testing for GBS is recommended for all pregnant women. What are the causes? This condition is caused by bacteria called Streptococcus agalactiae. What increases the risk? You may have a higher risk for GBS infection during pregnancy if you had one during a past pregnancy. What are the signs or symptoms? In most cases, GBS infection does not cause symptoms in pregnant women. If symptoms exist, they may include:  Labor that starts before the 37th week of pregnancy.  A UTI or bladder infection. This may cause a fever, frequent urination, or pain and burning during urination.  Fever during labor. There can also be a rapid heartbeat in the mother or baby. Rare but serious symptoms of a GBS infection in women include:  Blood infection (septicemia). This may cause fever, chills, or confusion.  Lung infection (pneumonia). This may cause fever, chills, cough, rapid breathing, chest pain, or difficulty breathing.  Bone, joint, skin, or soft tissue infection. How is this diagnosed? You may be screened for GBS between week 35 and week 37 of pregnancy. If you have symptoms of preterm labor, you may be screened earlier. This condition is  diagnosed based on lab test results from:  A swab of fluid from the vagina and rectum.  A urine sample. How is this treated? This condition is treated with antibiotic medicine. Antibiotic medicine may be given:  To you when you go into labor, or as soon as your water breaks. The medicines will continue until after you give birth. If you are having a cesarean delivery, you do not need antibiotics unless your water has broken.  To your baby, if he or she requires treatment. Your health care provider will check your baby to decide if he or she needs antibiotics to prevent a serious infection.   Follow these instructions at home:  Take over-the-counter and prescription medicines only as told by your health care provider.  Take your antibiotic medicine as told by your health care provider. Do not stop taking the antibiotic even if you start to feel better.  Keep all pre-birth (prenatal) visits and follow-up visits as told by your health care provider. This is important. Contact a health care provider if:  You have pain or burning when you urinate.  You have to urinate more often than usual.  You have a fever or chills.  You develop a bad-smelling vaginal discharge. Get help right away if:  Your water breaks.  You go into labor.  You have severe pain in your abdomen.  You have difficulty breathing.  You have chest pain. These symptoms may represent a serious problem that is an emergency. Do not wait to see if the symptoms will go away. Get medical help right away. Call your local emergency services (911 in the [place] Do not drive   yourself to the hospital. Summary  GBS is a type of bacteria that is common in healthy people.  During pregnancy, colonization with GBS can cause serious complications for you or your baby.  Your health care provider will screen you between 35 and 37 weeks of pregnancy to determine if you are colonized with GBS.  If you are colonized with GBS during  pregnancy, your health care provider will recommend antibiotics through an IV during labor.  After delivery, your baby will be evaluated for complications related to potential GBS infection and may require antibiotics to prevent a serious infection. This information is not intended to replace advice given to you by your health care provider. Make sure you discuss any questions you have with your health care provider. Document Revised: 02/23/2020 Document Reviewed: 11/17/2018 Elsevier Patient Education  2021 [employer] Pain Relief During Labor and Delivery Many things can cause pain during labor and delivery, including:  Pressure due to the baby moving through the pelvis.  Stretching of tissues due to the baby moving through the birth canal.  Muscle tension due to anxiety or nervousness.  The uterus tightening (contracting)and relaxing to help move the baby. How do I get pain relief during labor and delivery? Discuss your pain relief options with your health care provider during your prenatal visits. Explore the options offered by your hospital or birth center. There are many ways to deal with the pain of labor and delivery. You can try relaxation techniques or doing relaxing activities, taking a warm shower or bath (hydrotherapy), or other methods. There are also many medicines available to help control pain. Relaxation techniques and activities Practice relaxation techniques or do relaxing activities, such as:  Focused breathing.  Meditation.  Visualization.  Aroma therapy.  Listening to your favorite music.  Hypnosis. Hydrotherapy Take a warm shower or bath. This may:  Provide comfort and relaxation.  Lessen your feeling of pain.  Reduce the amount of pain medicine needed.  Shorten the length of labor. Other methods Try doing other things, such as:  Getting a massage or having counterpressure on your back.  Applying warm packs or ice packs.  Changing positions  often, moving around, or using a birthing ball. Medicines You may be given:  Pain medicine through an IV or an injection into a muscle.  Pain medicine inserted into your spinal column.  Injections of sterile water just under the skin on your lower back.  Nitrous oxide inhalation therapy, also called laughing gas.   What kinds of medicine are available for pain relief? There are two kinds of medicines that can be used to relieve pain during labor and delivery:  Analgesics. These medicines decrease pain without causing you to lose feeling or the ability to move your muscles.  Anesthetics. These medicines block feeling in the body and can decrease your ability to move freely. Both kinds of medicine can cause minor side effects, such as nausea, trouble concentrating, and sleepiness. They can also affect the baby's heart rate before birth and his or her breathing after birth. For this reason, health care providers are careful about when and how much medicine is given. Which medicines are used to provide pain relief? Common medicines The most common medicines used to help manage pain during labor and delivery include:  Opioids. Opioids are medicines that decrease how much pain you feel (perception of pain). These medicines can be given through an IV or may be used with anesthetics to block pain.  Epidural analgesia. ?  Epidural analgesia is given through a very thin tube that is inserted into the lower back. Medicine is delivered continuously to the area near your spinal column nerves (epidural space). After having this treatment, you may be able to move your legs, but you will not be able to walk. Depending on the amount and type of medicine given, you may lose all feeling in the lower half of your body, or you may have some sensation, including the urge to push. This treatment can be used to give pain relief for a vaginal birth. ? Sometimes, a numbing medicine is injected into the spinal fluid  when an epidural catheter is placed. This provides for immediate relief but only lasts for 1-2 hours. Once it wears off, the epidural will provide pain relief. This is called a combined spinal-epidural (CSE) block.  Intrathecal analgesia (spinal analgesia). Intrathecal analgesia is similar to epidural analgesia, but the medicine is injected into the spinal fluid instead of the epidural space. It is usually only given once. It starts to relieve pain quickly, but the pain relief lasts only 1-2 hours.  Pudendal block. This block is done by injecting numbing medicine through the wall of the vagina and into a nerve in the pelvis. Other medicines Other medicines used to help manage pain during labor and delivery include:  Local anesthetics. These are used to numb a small area of the body. They may be used along with another kind of medicine or used to numb the nerves of the vagina, cervix, and perineum during the second stage of labor.  Spinal block (spinal anesthesia). Spinal anesthesia is similar to spinal analgesia, but the medicine that is used contains longer-acting numbing medicines and pain medicines. This type of anesthesia can be used for a cesarean delivery and allows you to stay awake for the birth of your baby.  General anesthetics cause you to lose consciousness so you do not feel pain. They are usually only used for an emergency cesarean delivery. These medicines are given through an IV or a mask or both. These medicines are used as part of a procedure or for an emergency delivery. Summary  Women have many options to help them manage the pain associated with labor and delivery.  You can try doing relaxing activities, taking a warm shower or bath, or other methods.  There are also many medicines available to help control pain during labor and delivery.  Talk with your health care provider about what options are available to you. This information is not intended to replace advice given  to you by your health care provider. Make sure you discuss any questions you have with your health care provider. Document Revised: 03/11/2019 Document Reviewed: 03/11/2019 Elsevier Patient Education  2021 [employer]. Vaginal Delivery  Vaginal delivery means that you give birth by pushing your baby out of your birth canal (vagina). A team of health care providers will help you before, during, and after vaginal delivery. Birth experiences are unique for every woman and every pregnancy, and birth experiences vary depending on where you choose to give birth. What happens when I arrive at the birth center or hospital? Once you are in labor and have been admitted into the hospital or birth center, your health care provider may:  Review your pregnancy history and any concerns that you have.  Insert an IV into one of your veins. This may be used to give you fluids and medicines.  Check your blood pressure, pulse, temperature, and heart rate (vital  signs).  Check whether your bag of water (amniotic sac) has broken (ruptured).  Talk with you about your birth plan and discuss pain control options. Monitoring Your health care provider may monitor your contractions (uterine monitoring) and your baby's heart rate (fetal monitoring). You may need to be monitored:  Often, but not continuously (intermittently).  All the time or for long periods at a time (continuously). Continuous monitoring may be needed if: ? You are taking certain medicines, such as medicine to relieve pain or make your contractions stronger. ? You have pregnancy or labor complications. Monitoring may be done by:  Placing a special stethoscope or a handheld monitoring device on your abdomen to check your baby's heartbeat and to check for contractions.  Placing monitors on your abdomen (external monitors) to record your baby's heartbeat and the frequency and length of contractions.  Placing monitors inside your uterus through  your vagina (internal monitors) to record your baby's heartbeat and the frequency, length, and strength of your contractions. Depending on the type of monitor, it may remain in your uterus or on your baby's head until birth.  Telemetry. This is a type of continuous monitoring that can be done with external or internal monitors. Instead of having to stay in bed, you are able to move around during telemetry. Physical exam Your health care provider may perform frequent physical exams. This may include:  Checking how and where your baby is positioned in your uterus.  Checking your cervix to determine: ? Whether it is thinning out (effacing). ? Whether it is opening up (dilating). What happens during labor and delivery? Normal labor and delivery is divided into the following three stages: Stage 1  This is the longest stage of labor.  This stage can last for hours or days.  Throughout this stage, you will feel contractions. Contractions generally feel mild, infrequent, and irregular at first. They get stronger, more frequent (about every 2-3 minutes), and more regular as you move through this stage.  This stage ends when your cervix is completely dilated to 4 inches (10 cm) and completely effaced. Stage 2  This stage starts once your cervix is completely effaced and dilated and lasts until the delivery of your baby.  This stage may last from 20 minutes to 2 hours.  This is the stage where you will feel an urge to push your baby out of your vagina.  You may feel stretching and burning pain, especially when the widest part of your baby's head passes through the vaginal opening (crowning).  Once your baby is delivered, the umbilical cord will be clamped and cut. This usually occurs after waiting a period of 1-2 minutes after delivery.  Your baby will be placed on your bare chest (skin-to-skin contact) in an upright position and covered with a warm blanket. Watch your baby for feeding cues,  like rooting or sucking, and help the baby to your breast for his or her first feeding. Stage 3  This stage starts immediately after the birth of your baby and ends after you deliver the placenta.  This stage may take anywhere from 5 to 30 minutes.  After your baby has been delivered, you will feel contractions as your body expels the placenta and your uterus contracts to control bleeding.   What can I expect after labor and delivery?  After labor is over, you and your baby will be monitored closely until you are ready to go home to ensure that you are both healthy. Your health  care team will teach you how to care for yourself and your baby.  You and your baby will stay in the same room (rooming in) during your hospital stay. This will encourage early bonding and successful breastfeeding.  You may continue to receive fluids and medicines through an IV.  Your uterus will be checked and massaged regularly (fundal massage).  You will have some soreness and pain in your abdomen, vagina, and the area of skin between your vaginal opening and your anus (perineum).  If an incision was made near your vagina (episiotomy) or if you had some vaginal tearing during delivery, cold compresses may be placed on your episiotomy or your tear. This helps to reduce pain and swelling.  You may be given a squirt bottle to use instead of wiping when you go to the bathroom. To use the squirt bottle, follow these steps: ? Before you urinate, fill the squirt bottle with warm water. Do not use hot water. ? After you urinate, while you are sitting on the toilet, use the squirt bottle to rinse the area around your urethra and vaginal opening. This rinses away any urine and blood. ? Fill the squirt bottle with clean water every time you use the bathroom.  It is normal to have vaginal bleeding after delivery. Wear a sanitary pad for vaginal bleeding and discharge. Summary  Vaginal delivery means that you will give  birth by pushing your baby out of your birth canal (vagina).  Your health care provider may monitor your contractions (uterine monitoring) and your baby's heart rate (fetal monitoring).  Your health care provider may perform a physical exam.  Normal labor and delivery is divided into three stages.  After labor is over, you and your baby will be monitored closely until you are ready to go home. This information is not intended to replace advice given to you by your health care provider. Make sure you discuss any questions you have with your health care provider. Document Revised: 05/28/2017 Document Reviewed: 05/28/2017 Elsevier Patient Education  2021 [employer]

## 2020-05-25 NOTE — Progress Notes (Signed)
Pt having tooth pain, advised her to go see dentist. No vb. No lof.

## 2020-05-25 NOTE — Progress Notes (Signed)
  Routine Prenatal Care Visit  Subjective  Sabrina Singh is a 20 y.o. G2P0010 at [redacted]w[redacted]d being seen today for ongoing prenatal care.  She is currently monitored for the following issues for this low-risk pregnancy and has Supervision of normal pregnancy and Susceptible to varicella (non-immune), currently pregnant on their problem list.  ----------------------------------------------------------------------------------- Patient reports new tooth pain radiating to her right ear. She thinks it may be her wisdom tooth. She is advised to see her dentist.   Contractions: Not present. Vag. Bleeding: None.  Movement: Present. Leaking Fluid denies.  ----------------------------------------------------------------------------------- The following portions of the patient's history were reviewed and updated as appropriate: allergies, current medications, past family history, past medical history, past social history, past surgical history and problem list. Problem list updated.  Objective  Blood pressure 118/78, weight 201 lb (91.2 kg), last menstrual period 09/09/2019 Pregravid weight 170 lb (77.1 kg) Total Weight Gain 31 lb (14.1 kg) Urinalysis: Urine Protein    Urine Glucose    Fetal Status: Fetal Heart Rate (bpm): 138 Fundal Height: 37 cm Movement: Present     General:  Alert, oriented and cooperative. Patient is in no acute distress.  Skin: Skin is warm and dry. No rash noted.   Cardiovascular: Normal heart rate noted  Respiratory: Normal respiratory effort, no problems with respiration noted  Abdomen: Soft, gravid, appropriate for gestational age. Pain/Pressure: Present     Pelvic:  Cervical exam deferred        Extremities: Normal range of motion.  Edema: None  Mental Status: Normal mood and affect. Normal behavior. Normal judgment and thought content.   Assessment   20 y.o. G2P0010 at [redacted]w[redacted]d by  06/15/2020, by Last Menstrual Period presenting for routine prenatal visit  Plan   Pregnancy#2  Problems (from 09/09/19 to present)    Problem Noted Resolved   Supervision of normal pregnancy 12/09/2019 by Tresea Mall, CNM No   Overview Addendum 05/24/2020 11:19 AM by [name], CNM    Clinic [facility] Prenatal Labs  Dating [id number] [place] Blood type:   A positive  Genetic Screen 1 Screen:    AFP:     Quad:     NIPS: diploid XX Antibody: negative  Anatomic [place] XX Rubella: Immune Varicella: VNI  GTT Early- 99,   101 RPR: non reactive  Rhogam NA HBsAg: neg  Vaccines TDAP:                       Flu Shot:declined Covid:counseled, encouraged- unvaccinated HIV:  negative  Baby Food Undecided- plans formula re-iterated benefits of breastfeeding                         GBS:  05/20/20 [positive GC/CT: negative/negative  Contraception undecided Pap: 04/09/19 negative  CBB     CS/VBAC NA   Support Person [name]          Previous Version       Term labor symptoms and general obstetric precautions including but not limited to vaginal bleeding, contractions, leaking of fluid and fetal movement were reviewed in detail with the patient. Please refer to After Visit Summary for other counseling recommendations.   Return in about 1 week (around 06/01/2020) for rob.  Tresea Mall, CNM 05/25/2020 10:41 AM

## 2020-06-02 ENCOUNTER — Other Ambulatory Visit: Payer: Self-pay

## 2020-06-02 ENCOUNTER — Ambulatory Visit (INDEPENDENT_AMBULATORY_CARE_PROVIDER_SITE_OTHER): Payer: Medicaid Other | Admitting: Obstetrics and Gynecology

## 2020-06-02 VITALS — BP 132/79 | Wt 212.0 lb

## 2020-06-02 DIAGNOSIS — Z2839 Other underimmunization status: Secondary | ICD-10-CM

## 2020-06-02 DIAGNOSIS — O09899 Supervision of other high risk pregnancies, unspecified trimester: Secondary | ICD-10-CM

## 2020-06-02 DIAGNOSIS — Z3A38 38 weeks gestation of pregnancy: Secondary | ICD-10-CM

## 2020-06-02 DIAGNOSIS — B951 Streptococcus, group B, as the cause of diseases classified elsewhere: Secondary | ICD-10-CM

## 2020-06-02 DIAGNOSIS — Z3483 Encounter for supervision of other normal pregnancy, third trimester: Secondary | ICD-10-CM

## 2020-06-02 DIAGNOSIS — Z283 Underimmunization status: Secondary | ICD-10-CM

## 2020-06-02 NOTE — Progress Notes (Signed)
Obstetric H&P   Chief Complaint: ROB  Prenatal Care Provider: [facility]  History of Present Illness: 20 y.o. G2P0010 [redacted]w[redacted]d by 06/15/2020, by Last Menstrual Period presenting to for routine OB visit today.  +FM, no LOF, no VB, irregular contractions.  Pregnancy uncomplicated to date other than anemia.    Pregravid weight 170 lb (77.1 kg) Total Weight Gain 42 lb (19.1 kg)  Pregnancy#2 Problems (from 09/09/19 to present)    Problem Noted Resolved   Supervision of normal pregnancy 12/09/2019 by Tresea Mall, CNM No   Overview Addendum 05/25/2020 10:44 AM by Tresea Mall, CNM    [facility] Prenatal Labs  Dating LMP=13wk6d [place] Blood type:   A positive  Genetic Screen 1 Screen:    AFP:     Quad:     NIPS: diploid XX Antibody: negative  Anatomic [place] XX Rubella: Immune Varicella: VNI  GTT Early- 99,   101 RPR: non reactive  Rhogam NA HBsAg: neg  Vaccines TDAP:                       Flu Shot:declined Covid:counseled, encouraged- unvaccinated HIV:  negative  Baby Food Undecided- plans formula re-iterated benefits of breastfeeding                               GBS:  05/20/20 [positive GC/CT: negative/negative  Contraception undecided Pap: 04/09/19 negative  CBB     CS/VBAC NA   Support Person [name]          Previous Version       Review of Systems: 10 point review of systems negative unless otherwise noted in HPI  Past Medical History: Patient Active Problem List   Diagnosis Date Noted  . Supervision of normal pregnancy 12/09/2019    [facility] Prenatal Labs  Dating LMP=13wk6d [place] Blood type:   A positive  Genetic Screen 1 Screen:    AFP:     Quad:     NIPS: diploid XX Antibody: negative  Anatomic [place] XX Rubella: Immune Varicella: VNI  GTT Early- 99,   101 RPR: non reactive  Rhogam NA HBsAg: neg  Vaccines TDAP:                       Flu Shot:declined Covid:counseled, encouraged- unvaccinated HIV:  negative  Baby Food Undecided- plans formula re-iterated benefits of  breastfeeding                               GBS:  05/20/20 [positive GC/CT: negative/negative  Contraception undecided Pap: 04/09/19 negative  CBB     CS/VBAC NA   Support Person [name]       . Susceptible to varicella (non-immune), currently pregnant 11/11/2019    Formatting of this note might be different from the original. 11/04/19: Vaccinate Postpartum     Past Surgical History: No past surgical history on file.  Past Obstetric History: # 1 - Date: 2017, Sex: None, Weight: None, GA: None, Delivery: None, Apgar1: None, Apgar5: None, Living: None, Birth Comments: None  # 2 - Date: None, Sex: None, Weight: None, GA: None, Delivery: None, Apgar1: None, Apgar5: None, Living: None, Birth Comments: None   Past Gynecologic History:  Family History: No family history on file.  Social History: Social History   Socioeconomic History  . Marital status: Single    Spouse  name: Not on file  . Number of children: Not on file  . Years of education: Not on file  . Highest education level: Not on file  Occupational History  . Not on file  Tobacco Use  . Smoking status: Never Smoker  . Smokeless tobacco: Never Used  Substance and Sexual Activity  . Alcohol use: No  . Drug use: No  . Sexual activity: Yes    Birth control/protection: None  Other Topics Concern  . Not on file  Social History Narrative  . Not on file   Social Determinants of Health   Financial Resource Strain: Not on file  Food Insecurity: Not on file  Transportation Needs: Not on file  Physical Activity: Not on file  Stress: Not on file  Social Connections: Not on file  Intimate Partner Violence: Not on file    Medications: Prior to Admission medications   Medication Sig Start Date End Date Taking? Authorizing Provider  calcium carbonate (TUMS - DOSED IN MG ELEMENTAL CALCIUM) 500 MG chewable tablet Chew 1 tablet by mouth daily. Patient not taking: Reported on 06/02/2020    [provider]   ferrous sulfate 325 (65 FE) MG EC tablet Take 325 mg by mouth daily. Patient not taking: Reported on 06/02/2020    [provider]  Prenat-FeAsp-Meth-FA-DHA w/o A (PRENATE PIXIE) 10-0.6-0.4-200 MG CAPS Take 1 tablet by mouth daily. Patient not taking: Reported on 06/02/2020 02/02/20   Tresea Mall, CNM    Allergies: No Known Allergies  Physical Exam: Vitals: Blood pressure 132/79, weight 212 lb (96.2 kg), last menstrual period 09/09/2019, unknown if currently breastfeeding.  Urine Dip Protein: negative  FHT: 140  General: NAD HEENT: normocephalic, anicteric Pulmonary: No increased work of breathing Cardiovascular: RRR, distal pulses 2+ Abdomen: Gravid, non-tender Leopolds: vtx Genitourinary: 1/50/-3 Extremities: no edema, erythema, or tenderness Neurologic: Grossly intact Psychiatric: mood appropriate, affect full  Labs: No results found for this or any previous visit (from the past 24 hour(s)).  Assessment: 20 y.o. G2P0010 [redacted]w[redacted]d by 06/15/2020, by Last Menstrual Period presenting for ROB and discussion of delivery options  Plan: 1) The ARRIVE study was a national multicenter trial that randomized 6,106 patients to induction of labor at 39 weeks 0 days to 39 weeks 4 days (3,062) compared to expectant management (3,044).  There was no significant difference in adverse perinatal outcomes but there was a significantly lower rate of cesarean delivery, as well as lower rate of maternal hypertensive complications in the induction group.  Number to treat was calculated as 28 induction of labor to prevent 1 primary Cesarean section.  "Labor Induction versus Expectant Management in Nulliparous Low-Risk Women" The [place] Journal of Medicine iAugust 9, 2018 Vol. 379 No. 6  - patient opts to proceed with IOL  2) Fetus - _FHT  3) PNL - Blood type A/Positive/-- (08/19 1138) / Anti-bodyscreen Negative (08/19 1138) / Rubella 2.30 (08/19 1138) / Varicella non-immune / RPR Non  Reactive (11/19 1233) / HBsAg Negative (08/19 1138) / HIV Non Reactive (11/19 1233) / 1-hr OGTT 101 / GBS Positive/-- (01/14 1525) - offer varicella vaccine postpartum  4) Immunization History -  Immunization History  Administered Date(s) Administered  . Tdap 04/11/2020    5) Disposition - Return in about 6 days (around 06/08/2020) for ROB.   Vena Austria, MD, [facility], [facility] 06/02/2020, 11:50 AM

## 2020-06-02 NOTE — Progress Notes (Signed)
  [facility] INDUCTION ASSESSMENT SCHEDULING Sabrina Singh 2001-04-04 Medical record #: [id number] Phone #:  Home Phone [phone]  Mobile [phone]    [facility] Proposed admission date/time:06/09/2020 0800 Method of induction:Cytotec  Weight: Filed Weights01/27/22 1102Weight:212 lb (96.2 kg) BMI Body mass index is 37.55 kg/m. HIV Negative HSV Negative EDC Estimated Date of Delivery: 2/9/22based on:LMP  Gestational age on admission: [redacted]W[redacted]D Gravidity/parity:G2P0010  Cervix Score   0 1 2 3   Position Posterior Midposition Anterior   Consistency Firm Medium Soft   Effacement (%) 0-30 40-50 60-70 >80  Dilation (cm) Closed 1-2 3-4 >5  Baby's station -3 -2 -1 +1, +2   Bishop Score:4   Medical induction of labor  select indication(s) below Elective induction ?39 weeks multiparous patient ?39 weeks primiparous patient with Bishop score ?7 ?40 weeks primiparous patient   Medical Indications Adapted from ACOG Committee Opinion #560, "Medically Indicated Late Preterm and Early Term Deliveries," 2013.  PLACENTAL / UTERINE ISSUES FETAL ISSUES MATERNAL ISSUES  ? Placenta previa (36.0-37.6) ? Isoimmunization (37.0-38.6) ? Preeclampsia without severe features or gestational HTN (37.0)  ? Suspected accreta (34.0-35.6) ? Growth Restriction [birth year]1984) ? Preeclampsia with severe features (34.0)  ? Prior classical CD, uterine window, rupture (36.0-37.6) ? Isolated (38.0-39.6) ? Chronic HTN (38.0-39.6)  ? Prior myomectomy (37.0-38.6) ? Concurrent findings (34.0-37.6) ? Cholestasis (37.0)  ? Umbilical vein varix (37.0) ? Growth Restriction (Twins) ? Diabetes  ? Placental abruption (chronic) ? Di-Di Isolated (36.0-37.6) ? Pregestational, controlled (39.0)  OBSTETRIC ISSUES ? Di-Di concurrent findings (32.0-34.6) ? Pregestational, uncontrolled (37.0-39.0)  ? Postdates ? (41 weeks) ? Mo-Di isolated (32.0-34.6) ? Pregestational,  vascular compromise (37.0- 39.0)  ? PPROM (34.0) ? Multiple Gestation ? Gestational, diet controlled (40.0)  ? Hx of IUFD (39.0 weeks) ? Di-Di (38.0-38.6) ? Gestational, med controlled (39.0)  ? Polyhydramnios, mild/moderate; SDV 8-16 or AFI 25-35 (39.0) ? Mo-Di (36.0-37.6) ? Gestational, uncontrolled (38.0-39.0)  ? Oligohydramnios (36.0-37.6); MVP <2 cm  For indications not listed above, delivery recommendations from maternal-fetal medicine consultant occurred on: Date:  Provider Signature: Jerett Odonohue Scheduled by: marry  Date:06/02/2020 11:55 AM   Call [phone] to finalize the induction date/time  [phone] (07/17)

## 2020-06-07 ENCOUNTER — Other Ambulatory Visit: Payer: Self-pay

## 2020-06-07 ENCOUNTER — Other Ambulatory Visit
Admission: RE | Admit: 2020-06-07 | Discharge: 2020-06-07 | Disposition: A | Discharge status: Home / Self Care | Payer: Medicaid Other | Source: Ambulatory Visit | Attending: Obstetrics and Gynecology | Admitting: Obstetrics and Gynecology

## 2020-06-07 DIAGNOSIS — Z20822 Contact with and (suspected) exposure to covid-19: Secondary | ICD-10-CM | POA: Diagnosis not present

## 2020-06-07 DIAGNOSIS — Z01812 Encounter for preprocedural laboratory examination: Secondary | ICD-10-CM | POA: Diagnosis present

## 2020-06-07 LAB — SARS CORONAVIRUS 2 (TAT 6-24 HRS): SARS Coronavirus 2: NEGATIVE

## 2020-06-08 ENCOUNTER — Ambulatory Visit (INDEPENDENT_AMBULATORY_CARE_PROVIDER_SITE_OTHER): Payer: Medicaid Other | Admitting: Obstetrics and Gynecology

## 2020-06-08 ENCOUNTER — Encounter: Payer: Self-pay | Admitting: Obstetrics and Gynecology

## 2020-06-08 ENCOUNTER — Encounter: Payer: Medicaid Other | Admitting: Obstetrics and Gynecology

## 2020-06-08 VITALS — BP 132/84 | Wt 210.0 lb

## 2020-06-08 DIAGNOSIS — B951 Streptococcus, group B, as the cause of diseases classified elsewhere: Secondary | ICD-10-CM

## 2020-06-08 DIAGNOSIS — Z3483 Encounter for supervision of other normal pregnancy, third trimester: Secondary | ICD-10-CM

## 2020-06-08 DIAGNOSIS — Z3A39 39 weeks gestation of pregnancy: Secondary | ICD-10-CM

## 2020-06-08 NOTE — Progress Notes (Signed)
Routine Prenatal Care Visit  Subjective  Sabrina Singh is a 20 y.o. G2P0010 at [redacted]w[redacted]d being seen today for ongoing prenatal care.  She is currently monitored for the following issues for this low-risk pregnancy and has Supervision of normal pregnancy; Susceptible to varicella (non-immune), currently pregnant; and Positive GBS test on their problem list.  ----------------------------------------------------------------------------------- Patient reports no complaints.   Contractions: Irregular. Vag. Bleeding: None.  Movement: Present. Leaking Fluid denies.  ----------------------------------------------------------------------------------- The following portions of the patient's history were reviewed and updated as appropriate: allergies, current medications, past family history, past medical history, past social history, past surgical history and problem list. Problem list updated.  Objective  Blood pressure 132/84, weight 210 lb (95.3 kg), last menstrual period 09/09/2019, unknown if currently breastfeeding. Pregravid weight 170 lb (77.1 kg) Total Weight Gain 40 lb (18.1 kg) Urinalysis: Urine Protein    Urine Glucose    Fetal Status: Fetal Heart Rate (bpm): 150 Fundal Height: 39 cm Movement: Present  Presentation: Vertex  General:  Alert, oriented and cooperative. Patient is in no acute distress.  Skin: Skin is warm and dry. No rash noted.   Cardiovascular: Normal heart rate noted  Respiratory: Normal respiratory effort, no problems with respiration noted  Abdomen: Soft, gravid, appropriate for gestational age. Pain/Pressure: Absent     Pelvic:  Cervical exam performed Dilation: 1 Effacement (%): 50 Station: -3  Extremities: Normal range of motion.  Edema: Trace  Mental Status: Normal mood and affect. Normal behavior. Normal judgment and thought content.   Assessment   20 y.o. G2P0010 at [redacted]w[redacted]d by  06/15/2020, by Last Menstrual Period presenting for routine prenatal visit  Plan    Pregnancy#2 Problems (from 09/09/19 to present)    Problem Noted Resolved   Positive GBS test 06/02/2020 by Vena Austria, MD No   Supervision of normal pregnancy 12/09/2019 by Tresea Mall, CNM No   Overview Addendum 05/25/2020 10:44 AM by Tresea Mall, CNM    [facility] Prenatal Labs  Dating LMP=13wk6d [place] Blood type:   A positive  Genetic Screen 1 Screen:    AFP:     Quad:     NIPS: diploid XX Antibody: negative  Anatomic [place] XX Rubella: Immune Varicella: VNI  GTT Early- 99,   101 RPR: non reactive  Rhogam NA HBsAg: neg  Vaccines TDAP:                       Flu Shot:declined Covid:counseled, encouraged- unvaccinated HIV:  negative  Baby Food Undecided- plans formula re-iterated benefits of breastfeeding                               GBS:  05/20/20 [positive GC/CT: negative/negative  Contraception undecided Pap: 04/09/19 negative  CBB     CS/VBAC NA   Support Person [name]          Previous Version       Term labor symptoms and general obstetric precautions including but not limited to vaginal bleeding, contractions, leaking of fluid and fetal movement were reviewed in detail with the patient. Please refer to After Visit Summary for other counseling recommendations.   - discussed labor induction process in detail (cytotec, foley balloon, pitocin) and though unlikely possible c-section. All questions answered. I verified that admission orders have been placed by Dr. Bonney Aid.   Return in about 1 day (around 06/09/2020) for Induction of labor.   Thomasene Mohair, MD, Evern Core  [facility], [facility] 06/08/2020 11:40 AM

## 2020-06-09 ENCOUNTER — Inpatient Hospital Stay
Admission: EM | Admit: 2020-06-09 | Discharge: 2020-06-11 | DRG: 806 | Disposition: A | Discharge status: Home / Self Care | Payer: Medicaid Other | Attending: Obstetrics and Gynecology | Admitting: Obstetrics and Gynecology

## 2020-06-09 ENCOUNTER — Encounter: Payer: Self-pay | Admitting: Obstetrics and Gynecology

## 2020-06-09 ENCOUNTER — Other Ambulatory Visit: Payer: Self-pay

## 2020-06-09 ENCOUNTER — Inpatient Hospital Stay: Payer: Medicaid Other | Admitting: Certified Registered Nurse Anesthetist

## 2020-06-09 DIAGNOSIS — Z23 Encounter for immunization: Secondary | ICD-10-CM | POA: Diagnosis not present

## 2020-06-09 DIAGNOSIS — D62 Acute posthemorrhagic anemia: Secondary | ICD-10-CM | POA: Diagnosis not present

## 2020-06-09 DIAGNOSIS — Z3490 Encounter for supervision of normal pregnancy, unspecified, unspecified trimester: Secondary | ICD-10-CM | POA: Diagnosis present

## 2020-06-09 DIAGNOSIS — Z3A39 39 weeks gestation of pregnancy: Secondary | ICD-10-CM | POA: Diagnosis not present

## 2020-06-09 DIAGNOSIS — B951 Streptococcus, group B, as the cause of diseases classified elsewhere: Secondary | ICD-10-CM

## 2020-06-09 DIAGNOSIS — O9081 Anemia of the puerperium: Secondary | ICD-10-CM | POA: Diagnosis not present

## 2020-06-09 DIAGNOSIS — O99824 Streptococcus B carrier state complicating childbirth: Secondary | ICD-10-CM | POA: Diagnosis present

## 2020-06-09 DIAGNOSIS — Z3483 Encounter for supervision of other normal pregnancy, third trimester: Secondary | ICD-10-CM

## 2020-06-09 DIAGNOSIS — O26893 Other specified pregnancy related conditions, third trimester: Secondary | ICD-10-CM | POA: Diagnosis present

## 2020-06-09 DIAGNOSIS — O09899 Supervision of other high risk pregnancies, unspecified trimester: Secondary | ICD-10-CM

## 2020-06-09 LAB — CBC
HCT: 32.8 % — ABNORMAL LOW (ref 36.0–46.0)
Hemoglobin: 10.9 g/dL — ABNORMAL LOW (ref 12.0–15.0)
MCH: 27.5 pg (ref 26.0–34.0)
MCHC: 33.2 g/dL (ref 30.0–36.0)
MCV: 82.8 fL (ref 80.0–100.0)
Platelets: 199 10*3/uL (ref 150–400)
RBC: 3.96 MIL/uL (ref 3.87–5.11)
RDW: 13.8 % (ref 11.5–15.5)
WBC: 8.9 10*3/uL (ref 4.0–10.5)
nRBC: 0 % (ref 0.0–0.2)

## 2020-06-09 LAB — TYPE AND SCREEN
ABO/RH(D): A POS
Antibody Screen: NEGATIVE

## 2020-06-09 MED ORDER — LIDOCAINE HCL (PF) 1 % IJ SOLN
INTRAMUSCULAR | Status: AC
  Filled 2020-06-09: qty 30

## 2020-06-09 MED ORDER — HYDROCODONE-ACETAMINOPHEN 5-325 MG PO TABS
1.0000 | ORAL_TABLET | Freq: Four times a day (QID) | ORAL | Status: DC | PRN
  Administered 2020-06-10 – 2020-06-11 (×2): 1 via ORAL
  Filled 2020-06-09 (×2): qty 1

## 2020-06-09 MED ORDER — PHENYLEPHRINE 40 MCG/ML (10ML) SYRINGE FOR IV PUSH (FOR BLOOD PRESSURE SUPPORT): 80.0000 ug | PREFILLED_SYRINGE | INTRAVENOUS | Status: DC | PRN

## 2020-06-09 MED ORDER — FENTANYL 2.5 MCG/ML W/ROPIVACAINE 0.15% IN NS 100 ML EPIDURAL (ARMC)
EPIDURAL | Status: AC
  Filled 2020-06-09: qty 100

## 2020-06-09 MED ORDER — OXYTOCIN BOLUS FROM INFUSION
333.0000 mL | Freq: Once | INTRAVENOUS | Status: DC
  Administered 2020-06-09: 333 mL via INTRAVENOUS

## 2020-06-09 MED ORDER — BUTORPHANOL TARTRATE 1 MG/ML IJ SOLN
1.0000 mg | INTRAMUSCULAR | Status: DC | PRN
Start: 2020-06-09 — End: 2020-06-09
  Administered 2020-06-09 (×2): 1 mg via INTRAVENOUS
  Filled 2020-06-09 (×2): qty 1

## 2020-06-09 MED ORDER — IBUPROFEN 600 MG PO TABS
600.0000 mg | ORAL_TABLET | Freq: Four times a day (QID) | ORAL | Status: DC
  Administered 2020-06-09 – 2020-06-11 (×6): 600 mg via ORAL
  Filled 2020-06-09 (×7): qty 1

## 2020-06-09 MED ORDER — LIDOCAINE HCL (PF) 1 % IJ SOLN
INTRAMUSCULAR | Status: DC | PRN
  Administered 2020-06-09 (×2): 2 mL

## 2020-06-09 MED ORDER — AMMONIA AROMATIC IN INHA
RESPIRATORY_TRACT | Status: AC
  Filled 2020-06-09: qty 10

## 2020-06-09 MED ORDER — LACTATED RINGERS IV SOLN
500.0000 mL | Freq: Once | INTRAVENOUS | Status: AC
  Administered 2020-06-09: 500 mL via INTRAVENOUS

## 2020-06-09 MED ORDER — LIDOCAINE-EPINEPHRINE (PF) 1.5 %-1:200000 IJ SOLN
INTRAMUSCULAR | Status: DC | PRN
  Administered 2020-06-09: 4 mL via PERINEURAL

## 2020-06-09 MED ORDER — SOD CITRATE-CITRIC ACID 500-334 MG/5ML PO SOLN: 30.0000 mL | ORAL | Status: DC | PRN

## 2020-06-09 MED ORDER — TERBUTALINE SULFATE 1 MG/ML IJ SOLN: 0.2500 mg | Freq: Once | INTRAMUSCULAR | Status: DC | PRN

## 2020-06-09 MED ORDER — BUPIVACAINE HCL (PF) 0.25 % IJ SOLN
INTRAMUSCULAR | Status: DC | PRN
  Administered 2020-06-09: 2 mL via EPIDURAL
  Administered 2020-06-09: 4 mL via EPIDURAL

## 2020-06-09 MED ORDER — SODIUM CHLORIDE 0.9 % IV SOLN
5.0000 10*6.[IU] | Freq: Once | INTRAVENOUS | Status: AC
  Administered 2020-06-09: 5 10*6.[IU] via INTRAVENOUS
  Filled 2020-06-09: qty 5

## 2020-06-09 MED ORDER — MISOPROSTOL 200 MCG PO TABS
ORAL_TABLET | ORAL | Status: AC
  Filled 2020-06-09: qty 4

## 2020-06-09 MED ORDER — ONDANSETRON HCL 4 MG/2ML IJ SOLN
4.0000 mg | Freq: Four times a day (QID) | INTRAMUSCULAR | Status: DC | PRN
  Administered 2020-06-09: 4 mg via INTRAVENOUS
  Filled 2020-06-09: qty 2

## 2020-06-09 MED ORDER — ACETAMINOPHEN 325 MG PO TABS
650.0000 mg | ORAL_TABLET | ORAL | Status: DC | PRN
  Administered 2020-06-09 – 2020-06-10 (×4): 650 mg via ORAL
  Filled 2020-06-09 (×4): qty 2

## 2020-06-09 MED ORDER — DIPHENHYDRAMINE HCL 50 MG/ML IJ SOLN: 12.5000 mg | INTRAMUSCULAR | Status: DC | PRN

## 2020-06-09 MED ORDER — OXYTOCIN-SODIUM CHLORIDE 30-0.9 UT/500ML-% IV SOLN: 2.5000 [IU]/h | INTRAVENOUS | Status: DC

## 2020-06-09 MED ORDER — EPHEDRINE 5 MG/ML INJ: 10.0000 mg | INTRAVENOUS | Status: DC | PRN

## 2020-06-09 MED ORDER — ONDANSETRON HCL 4 MG/2ML IJ SOLN: 4.0000 mg | INTRAMUSCULAR | Status: DC | PRN

## 2020-06-09 MED ORDER — FENTANYL 2.5 MCG/ML W/ROPIVACAINE 0.15% IN NS 100 ML EPIDURAL (ARMC)
12.0000 mL/h | EPIDURAL | Status: DC
  Administered 2020-06-09: 12 mL/h via EPIDURAL

## 2020-06-09 MED ORDER — LACTATED RINGERS IV SOLN
500.0000 mL | INTRAVENOUS | Status: DC | PRN
  Administered 2020-06-09: 1000 mL via INTRAVENOUS

## 2020-06-09 MED ORDER — ACETAMINOPHEN 325 MG PO TABS: 650.0000 mg | ORAL_TABLET | ORAL | Status: DC | PRN

## 2020-06-09 MED ORDER — OXYTOCIN-SODIUM CHLORIDE 30-0.9 UT/500ML-% IV SOLN: 1.0000 m[IU]/min | INTRAVENOUS | Status: DC

## 2020-06-09 MED ORDER — MISOPROSTOL 25 MCG QUARTER TABLET
25.0000 ug | ORAL_TABLET | ORAL | Status: DC | PRN
  Administered 2020-06-09: 25 ug via VAGINAL
  Filled 2020-06-09: qty 1

## 2020-06-09 MED ORDER — EPHEDRINE 5 MG/ML INJ
INTRAVENOUS | Status: AC
  Administered 2020-06-09: 10 mg via INTRAVENOUS
  Filled 2020-06-09: qty 4

## 2020-06-09 MED ORDER — OXYTOCIN 10 UNIT/ML IJ SOLN
INTRAMUSCULAR | Status: AC
  Filled 2020-06-09: qty 2

## 2020-06-09 MED ORDER — LACTATED RINGERS IV SOLN: INTRAVENOUS | Status: DC

## 2020-06-09 MED ORDER — BENZOCAINE-MENTHOL 20-0.5 % EX AERO
1.0000 "application " | INHALATION_SPRAY | CUTANEOUS | Status: DC | PRN
  Filled 2020-06-09: qty 56

## 2020-06-09 MED ORDER — ONDANSETRON HCL 4 MG PO TABS
4.0000 mg | ORAL_TABLET | ORAL | Status: DC | PRN
  Filled 2020-06-09: qty 1

## 2020-06-09 MED ORDER — OXYTOCIN-SODIUM CHLORIDE 30-0.9 UT/500ML-% IV SOLN
INTRAVENOUS | Status: AC
  Administered 2020-06-09: 1 m[IU]/min via INTRAVENOUS
  Filled 2020-06-09: qty 1000

## 2020-06-09 MED ORDER — LIDOCAINE HCL (PF) 1 % IJ SOLN: 30.0000 mL | INTRAMUSCULAR | Status: DC | PRN

## 2020-06-09 MED ORDER — PENICILLIN G POT IN DEXTROSE 60000 UNIT/ML IV SOLN
3.0000 10*6.[IU] | INTRAVENOUS | Status: DC
  Administered 2020-06-09 (×2): 3 10*6.[IU] via INTRAVENOUS
  Filled 2020-06-09 (×2): qty 50

## 2020-06-09 NOTE — H&P (Signed)
History and Physical Interval Note:  06/09/2020 7:17 AM  Sabrina Singh  has presented today for INDUCTION OF LABOR (cervical ripening agents),  with the diagnosis of Elective induction at term. The various methods of treatment have been discussed with the patient and family. After consideration of risks, benefits and other options for treatment, the patient has consented to  Labor induction .  The patient's history has been reviewed, patient examined, no change in status, and is stable for induction as planned.  See H&P. I have reviewed the patient's chart and labs.  Questions were answered to the patient's satisfaction.    IOL initiated with foley bulb placement and Cytotec.   Zipporah Plants, CNM [facility], [facility] 06/09/2020  7:17 AM

## 2020-06-09 NOTE — Progress Notes (Signed)
Labor Check  Subj:  Complaints: comfortable with epidural. She had an episode of trouble breathing due to her epidural being high (she had trouble moving her hands).  She was evaluated by anesthesia and by that time her epidural had been shot off, she was able to move her hands. She is feeling better now and is understandably nervous about the epidural.    Obj:  BP (!) 102/42   Pulse 76   Temp 98.8 F (37.1 C) (Oral)   Resp 16   Ht 5\' 3"  (1.6 m)   Wt 95.3 kg   LMP 09/09/2019   SpO2 100%   BMI 37.22 kg/m     Cervix: Dilation: 4 / Effacement (%): 60,70 / Station: -2  Baseline FHR: 135 beats/min   Variability: moderate   Accelerations: present   Decelerations: absent Contractions: present frequency: 4-5 q 10 min Overall assessment: cat 1  Female chaperone present for pelvic exam:   A/P: 20 y.o. G2P0010 female at [redacted]w[redacted]d with elective IOL.  1.  Labor: start pitocin at a low dose and increase slowly  2.  FWB: reassuring, Overall assessment: category 2  3.  GBS positive - receiving PCN  4.  Pain: epidural. If needed, may start at low rate. 5.  Recheck: prn    [redacted]w[redacted]d, MD, [facility] Mohair [facility], [facility] 06/09/2020 3:40 PM

## 2020-06-09 NOTE — Progress Notes (Addendum)
Pt requested an epidural, Irving Burton, CRNA and Ronnald Ramp, CRNA at bedside for placement of epidural. Pt sitting up in the bed from 1316 to 1410 for placement of epidural. Pt laid down at 1417 after epidural placement. Pt states she can not feel her toes. Pt is unable to wiggles toes or bend her knees. RN placed foley catheter. Pt reports she is feeling light-headed and short of breath. BP 107/57 and repeat BP 102/86. 10 mg of Ephedrine given IV and LR bolus given. Repeat BP 130/86 at 1432. Pt states she is unable to breathe. A.Zak, MD called to bedside and assess pt. Epidural infusion stopped at 1436. S.Jackson, MD at bedside as well to review fetal strip. Pt received an additional LR bolus. Pt states she feels " a lot better, I can breathe better."   1530- Pt states she can feel her breasts but overall feels better. Edison Pace, MD at bedside, SVE performed 4/60/-2 and intact. Order to start pitocin.   1632-  Pt still can not wiggle toes or bend knees.

## 2020-06-09 NOTE — Anesthesia Procedure Notes (Addendum)
Epidural Patient location during procedure: OB Start time: 06/09/2020 1:29 PM End time: 06/09/2020 1:40 PM  Staffing Anesthesiologist: Corinda Gubler, MD Resident/CRNA: Irving Burton, CRNA Performed: resident/CRNA   Preanesthetic Checklist Completed: patient identified, IV checked, site marked, risks and benefits discussed, surgical consent, monitors and equipment checked, pre-op evaluation and timeout performed  Epidural Patient position: sitting Prep: ChloraPrep Patient monitoring: heart rate, continuous pulse ox and blood pressure Approach: midline Location: L3-L4 Injection technique: LOR saline  Needle:  Needle type: Tuohy  Needle gauge: 17 G Needle length: 9 cm and 9 Needle insertion depth: 8 cm Catheter size: 19 Gauge Catheter at skin depth: 13 cm Test dose: negative and 1.5% lidocaine with Epi 1:200 K  Assessment Sensory level: T10 Events: blood not aspirated, injection not painful, no injection resistance, no paresthesia and negative IV test  Additional Notes 2 attempt Pt. Evaluated and documentation done after procedure finished. Patient identified. Risks/Benefits/Options discussed with patient including but not limited to bleeding, infection, nerve damage, paralysis, failed block, incomplete pain control, headache, blood pressure changes, nausea, vomiting, reactions to medication both or allergic, itching and postpartum back pain. Confirmed with bedside nurse the patient's most recent platelet count. Confirmed with patient that they are not currently taking any anticoagulation, have any bleeding history or any family history of bleeding disorders. Patient expressed understanding and wished to proceed. All questions were answered. Sterile technique was used throughout the entire procedure. Please see nursing notes for vital signs. Test dose was given through epidural catheter and negative prior to continuing to dose epidural or start infusion. Warning signs of high block  given to the patient including shortness of breath, tingling/numbness in hands, complete motor block, or any concerning symptoms with instructions to call for help. Patient was given instructions on fall risk and not to get out of bed. All questions and concerns addressed with instructions to call with any issues or inadequate analgesia.   Patient tolerated the insertion well without immediate complications.Reason for block:procedure for pain

## 2020-06-09 NOTE — Progress Notes (Addendum)
Sabrina Singh is a 20 y.o. G2P0010 at [redacted]w[redacted]d by ultrasound admitted for induction of labor due to Elective at term.She has received one dose of Cytotec, and had a foley ball catheter placed which fell out after several hours. Subjective:  Increasing discomfort. She wants something for pain. Plans an epidural.   Objective: BP 120/90 (BP Location: Left Arm)   Pulse 81   Temp 98.8 F (37.1 C) (Oral)   Resp 16   Ht 5\' 3"  (1.6 m)   Wt 95.3 kg   LMP 09/09/2019   SpO2 99%   BMI 37.22 kg/m  I/O last 3 completed shifts: In: 49.3 [I.V.:49.3] Out: -  Total I/O In: 212.4 [I.V.:212.4] Out: -   FHT:  FHR: 130 baseline bpm, variability: moderate,  accelerations:  Present,  decelerations:  Absent UC:   regular, every 1.5-3 minutes SVE:   Dilation: 4 Effacement (%): 60 Station: -2 Exam by:: M.Lauretta Sallas,CNM  Bishop score is a 6  Labs: Lab Results  Component Value Date   WBC 8.9 06/09/2020   HGB 10.9 (L) 06/09/2020   HCT 32.8 (L) 06/09/2020   MCV 82.8 06/09/2020   PLT 199 06/09/2020    Assessment / Plan: Induction of labor due to term with favorable cervix, responding well to cytotec and foley.  Labor: Progressing normally  Fetal Wellbeing:  Category I Pain Control:  IV pain meds now- will give one dose of Stadol, then epidural likely  I/D:  n/a Anticipated MOD:  NSVD   Will start her IV PCN for GBS+ status. She may have an epidural when she next requests it.  08/07/2020 06/09/2020, 12:22 PM

## 2020-06-09 NOTE — Discharge Summary (Signed)
Postpartum Discharge Summary   Patient Name: Sabrina Singh DOB: [birth year] MRN: [id number]  Date of admission: 06/09/2020 Delivery date:06/09/2020  Delivering provider: Prentice Docker D  Date of discharge: 06/11/2020  Admitting diagnosis: Encounter for elective induction of labor [Z34.90] Intrauterine pregnancy: [redacted]w[redacted]d    Secondary diagnosis:  Principal Problem:   Encounter for elective induction of labor Active Problems:   Supervision of normal pregnancy   Susceptible to varicella (non-immune), currently pregnant   Positive GBS test   [redacted] weeks gestation of pregnancy  Additional problems: None    Discharge diagnosis: Term Pregnancy Delivered                                              Post partum procedures:none Augmentation: Pitocin, Cytotec and IP Foley Complications: None  Hospital course: Induction of Labor With Vaginal Delivery   20y.o. yo G2P0010 at 334w1das admitted to the hospital 06/09/2020 for induction of labor.  Indication for induction: Elective.  Patient had an uncomplicated labor course as follows: Membrane Rupture Time/Date: 8:18 PM ,06/09/2020   Delivery Method:Vaginal, Spontaneous  Episiotomy: None  Lacerations:  Labial;1st degree  Details of delivery can be found in separate delivery note.  Patient had a routine postpartum course. Patient is discharged home 06/11/20.  Newborn Data: Birth date:06/09/2020  Birth time:10:24 PM  Gender:Female  Living status:Living  Apgars:8 ,9  Weight:3060 g   Magnesium Sulfate received: No BMZ received: No Rhophylac:N/A MMR:No T-DaP:Given prenatally 04/11/2020 Flu: No Transfusion:No  Physical exam  Vitals:   06/10/20 0815 06/10/20 1156 06/10/20 2325 06/11/20 0733  BP: 126/66 124/86 126/75 131/84  Pulse: 74 86 80 76  Resp: '20 18 18 18  ' Temp: 98.3 F (36.8 C) 99.3 F (37.4 C) 98.4 F (36.9 C) 98.2 F (36.8 C)  TempSrc: Oral Oral Oral Oral  SpO2: 100%  100% 100%  Weight:      Height:       General: alert,  cooperative and no distress Lochia: appropriate Uterine Fundus: firm Incision: N/A DVT Evaluation: No evidence of DVT seen on physical exam. Labs: Lab Results  Component Value Date   WBC 14.3 (H) 06/10/2020   HGB 9.5 (L) 06/10/2020   HCT 28.7 (L) 06/10/2020   MCV 82.9 06/10/2020   PLT 187 06/10/2020   CMP Latest Ref Rng & Units 10/20/2019  Glucose 70 - 99 mg/dL 100(H)  BUN 6 - 20 mg/dL 15  Creatinine 0.44 - 1.00 mg/dL 0.81  Sodium 135 - 145 mmol/L 135  Potassium 3.5 - 5.1 mmol/L 3.8  Chloride 98 - 111 mmol/L 104  CO2 22 - 32 mmol/L 26  Calcium 8.9 - 10.3 mg/dL 8.9  Total Protein 6.5 - 8.1 g/dL 7.5  Total Bilirubin 0.3 - 1.2 mg/dL 0.7  Alkaline Phos 38 - 126 U/L 40  AST 15 - 41 U/L 23  ALT 0 - 44 U/L 22   Edinburgh Score: Edinburgh Postnatal Depression Scale Screening Tool 06/10/2020  I have been able to laugh and see the funny side of things. (No Data)      After visit meds:  Allergies as of 06/11/2020   No Known Allergies     Medication List    STOP taking these medications   calcium carbonate 500 MG chewable tablet Commonly known as: TUMS - dosed in mg elemental calcium     TAKE  these medications   ferrous sulfate 325 (65 FE) MG EC tablet Take 325 mg by mouth daily.   HYDROcodone-acetaminophen 5-325 MG tablet Commonly known as: NORCO/VICODIN Take 1 tablet by mouth every 6 (six) hours as needed (breakthrough pain or headaches).   Prenate Pixie 10-0.6-0.4-200 MG Caps Take 1 tablet by mouth daily.        Discharge home in stable condition Infant Feeding: formula Infant Disposition:home with mother Discharge instruction: per After Visit Summary and Postpartum booklet. Activity: Advance as tolerated. Pelvic rest for 6 weeks.  Diet: routine diet Anticipated Birth Control: Unsure Postpartum Appointment:6 weeks Additional Postpartum F/U: 4 week check and planning for contraception Future Appointments:No future appointments. Follow up Visit:  Follow-up  Information    Will Bonnet, MD. Schedule an appointment as soon as possible for a visit in 4 week(s).   Specialty: Obstetrics and Gynecology Why: 4 week postpartum visit Contact information: [address] [place] [address] [phone]               SIGNED: Barnett Applebaum, MD, [facility], [facility] 06/11/2020  9:00 AM

## 2020-06-09 NOTE — Progress Notes (Signed)
RN called Sabrina Pick, MD anesthesiologist to assess pt and her epidural. Pt can wiggle her toes and reports pain 10/10. MD ordered to restart Fentanyl infusion at 68mL/hr. MD also ordered 1mg  Stadol for pain. Pt refuses stadol state she does not want to start all the medicine at once and risk happening what occurred earlier after her epidural placement.

## 2020-06-09 NOTE — Anesthesia Preprocedure Evaluation (Addendum)
Anesthesia Evaluation  Patient identified by MRN, date of birth, ID band Patient awake    Reviewed: Allergy & Precautions, H&P , NPO status , Patient's Chart, lab work & pertinent test results  Airway Mallampati: II   Neck ROM: full    Dental no notable dental hx.  Tongue Ring:   Pulmonary neg pulmonary ROS,    Pulmonary exam normal        Cardiovascular negative cardio ROS Normal cardiovascular exam     Neuro/Psych negative neurological ROS  negative psych ROS   GI/Hepatic negative GI ROS, Neg liver ROS, GERD  Controlled,  Endo/Other  negative endocrine ROS  Renal/GU negative Renal ROS  negative genitourinary   Musculoskeletal   Abdominal   Peds  Hematology negative hematology ROS (+)   Anesthesia Other Findings   Reproductive/Obstetrics (+) Pregnancy                            Anesthesia Physical Anesthesia Plan  ASA: II  Anesthesia Plan: Epidural   Post-op Pain Management:    Induction:   PONV Risk Score and Plan:   Airway Management Planned:   Additional Equipment:   Intra-op Plan:   Post-operative Plan:   Informed Consent: I have reviewed the patients History and Physical, chart, labs and discussed the procedure including the risks, benefits and alternatives for the proposed anesthesia with the patient or authorized representative who has indicated his/her understanding and acceptance.       Plan Discussed with: Anesthesiologist and CRNA  Anesthesia Plan Comments:         Anesthesia Quick Evaluation

## 2020-06-09 NOTE — Progress Notes (Addendum)
Sabrina Singh is a 20 y.o. G2P0010 at [redacted]w[redacted]d by ultrasound admitted for induction of labor due to Elective at term.She plans an epidural. Has had one dose of Cytotec and foley ball placement. Subjective:  Her foley ball just fell out while she was sitting on the toilet. She is feeling lots of cramping. Denies any leaking of fluid.   Objective: BP 126/83   Pulse 79   Temp 98.7 F (37.1 C) (Oral)   Resp 16   Ht 5\' 3"  (1.6 m)   Wt 95.3 kg   LMP 09/09/2019   BMI 37.22 kg/m  I/O last 3 completed shifts: In: 49.3 [I.V.:49.3] Out: -  Total I/O In: 212.4 [I.V.:212.4] Out: -   FHT:  FHR: 135 bpm, variability: moderate,  accelerations:  Present although occasional only. decelerations:  Absent UC:   irregular, every 1.5-3.5 minutes SVE:   Dilation: 3.5 Effacement (%): 60 Station: -3 Exam by:: Sabrina Singh,CNM  Bishop score 6  Labs: Lab Results  Component Value Date   WBC 8.9 06/09/2020   HGB 10.9 (L) 06/09/2020   HCT 32.8 (L) 06/09/2020   MCV 82.8 06/09/2020   PLT 199 06/09/2020    Assessment / Plan: Induction of labor due to term with favorable cervix,   Labor: Progressing normally with cervcial ripening  Fetal Wellbeing:  Category II Pain Control:  Labor support without medications  She plan an epidural I/D:  n/a Anticipated MOD:  NSVD   Will start pitocin per protocol at 11:00.  Anticipate epidural placement once she is more uncomfortable.  08/07/2020 06/09/2020, 11:04 AM

## 2020-06-09 NOTE — Progress Notes (Signed)
Pt is requesting IV pain medicine. Pt is still reporting 10/10 contraction pains.

## 2020-06-10 ENCOUNTER — Encounter: Payer: Self-pay | Admitting: Obstetrics and Gynecology

## 2020-06-10 LAB — CBC
HCT: 28.7 % — ABNORMAL LOW (ref 36.0–46.0)
Hemoglobin: 9.5 g/dL — ABNORMAL LOW (ref 12.0–15.0)
MCH: 27.5 pg (ref 26.0–34.0)
MCHC: 33.1 g/dL (ref 30.0–36.0)
MCV: 82.9 fL (ref 80.0–100.0)
Platelets: 187 10*3/uL (ref 150–400)
RBC: 3.46 MIL/uL — ABNORMAL LOW (ref 3.87–5.11)
RDW: 14 % (ref 11.5–15.5)
WBC: 14.3 10*3/uL — ABNORMAL HIGH (ref 4.0–10.5)
nRBC: 0 % (ref 0.0–0.2)

## 2020-06-10 LAB — RPR: RPR Ser Ql: NONREACTIVE

## 2020-06-10 MED ORDER — FAMOTIDINE 20 MG PO TABS
20.0000 mg | ORAL_TABLET | Freq: Two times a day (BID) | ORAL | Status: DC
  Administered 2020-06-10 (×2): 20 mg via ORAL
  Filled 2020-06-10 (×3): qty 1

## 2020-06-10 MED ORDER — FERROUS SULFATE 325 (65 FE) MG PO TABS
325.0000 mg | ORAL_TABLET | Freq: Two times a day (BID) | ORAL | Status: DC
  Administered 2020-06-10: 325 mg via ORAL
  Filled 2020-06-10 (×3): qty 1

## 2020-06-10 MED ORDER — SENNOSIDES-DOCUSATE SODIUM 8.6-50 MG PO TABS
2.0000 | ORAL_TABLET | ORAL | Status: DC
  Filled 2020-06-10 (×2): qty 2

## 2020-06-10 MED ORDER — PRENATAL MULTIVITAMIN CH
1.0000 | ORAL_TABLET | Freq: Every day | ORAL | Status: DC
  Administered 2020-06-10: 1 via ORAL
  Filled 2020-06-10: qty 1

## 2020-06-10 MED ORDER — WITCH HAZEL-GLYCERIN EX PADS
1.0000 | MEDICATED_PAD | CUTANEOUS | Status: DC | PRN
Start: 2020-06-09 — End: 2020-06-11

## 2020-06-10 MED ORDER — COCONUT OIL OIL: 1.0000 "application " | TOPICAL_OIL | Status: DC | PRN

## 2020-06-10 MED ORDER — SIMETHICONE 80 MG PO CHEW
80.0000 mg | CHEWABLE_TABLET | ORAL | Status: DC | PRN
Start: 2020-06-10 — End: 2020-06-11

## 2020-06-10 MED ORDER — VARICELLA VIRUS VACCINE LIVE 1350 PFU/0.5ML IJ SUSR
0.5000 mL | INTRAMUSCULAR | Status: AC | PRN
  Administered 2020-06-11: 0.5 mL via SUBCUTANEOUS
  Filled 2020-06-10 (×2): qty 0.5

## 2020-06-10 MED ORDER — DIBUCAINE (PERIANAL) 1 % EX OINT: 1.0000 "application " | TOPICAL_OINTMENT | CUTANEOUS | Status: DC | PRN

## 2020-06-10 MED ORDER — DIPHENHYDRAMINE HCL 25 MG PO CAPS: 25.0000 mg | ORAL_CAPSULE | Freq: Four times a day (QID) | ORAL | Status: DC | PRN

## 2020-06-10 NOTE — Anesthesia Postprocedure Evaluation (Signed)
Anesthesia Post Note  Patient: Sabrina Singh  Procedure(s) Performed: AN AD HOC LABOR EPIDURAL  Patient location during evaluation: Mother Baby Anesthesia Type: Epidural Level of consciousness: awake and alert Pain management: pain level controlled Vital Signs Assessment: post-procedure vital signs reviewed and stable Respiratory status: spontaneous breathing, nonlabored ventilation and respiratory function stable Cardiovascular status: stable Postop Assessment: no headache, no backache and epidural receding Anesthetic complications: no Comments: Patient complaining of back pain.  Patient instructed to monitor and inform nursing and medical staff managing care if pain does not improve.   No complications documented.   Last Vitals:  Vitals:   06/10/20 0139 06/10/20 0314  BP: 124/70 132/82  Pulse: 81 78  Resp: (!) 24 20  Temp: 36.9 C 36.8 C  SpO2: 100% 100%    Last Pain:  Vitals:   06/10/20 0325  TempSrc:   PainSc: 3                  Lynden Oxford

## 2020-06-10 NOTE — Progress Notes (Signed)
   Subjective:  Doing well postpartum day 1: she does have headache pain. She is tolerating regular diet. Her pain is controlled with PO medication. She is ambulating and voiding without difficulty.   Objective:  Vital signs in last 24 hours: Temp:  [97.9 F (36.6 C)-98.8 F (37.1 C)] 98.3 F (36.8 C) (02/04 0815) Pulse Rate:  [67-160] 74 (02/04 0815) Resp:  [16-24] 20 (02/04 0815) BP: (97-160)/(42-105) 126/66 (02/04 0815) SpO2:  [97 %-100 %] 100 % (02/04 0815)    General: NAD Pulmonary: no increased work of breathing Abdomen: non-distended, non-tender, fundus firm at level of umbilicus Extremities: no edema, no erythema, no tenderness  Results for orders placed or performed during the hospital encounter of 06/09/20 (from the past 72 hour(s))  CBC     Status: Abnormal   Collection Time: 06/09/20  6:02 AM  Result Value Ref Range   WBC 8.9 4.0 - 10.5 K/uL   RBC 3.96 3.87 - 5.11 MIL/uL   Hemoglobin 10.9 (L) 12.0 - 15.0 g/dL   HCT 49.7 (L) 02.6 - 37.8 %   MCV 82.8 80.0 - 100.0 fL   MCH 27.5 26.0 - 34.0 pg   MCHC 33.2 30.0 - 36.0 g/dL   RDW 58.8 50.2 - 77.4 %   Platelets 199 150 - 400 K/uL   nRBC 0.0 0.0 - 0.2 %    Comment: Performed at [facility], [address] [place], [place] [address]  Type and screen     Status: None   Collection Time: 06/09/20  6:02 AM  Result Value Ref Range   ABO/RH(D) A POS    Antibody Screen NEG    Sample Expiration      06/12/2020,2359 Performed at [facility], [address] [place], [place] [address]   CBC     Status: Abnormal   Collection Time: 06/10/20  6:26 AM  Result Value Ref Range   WBC 14.3 (H) 4.0 - 10.5 K/uL   RBC 3.46 (L) 3.87 - 5.11 MIL/uL   Hemoglobin 9.5 (L) 12.0 - 15.0 g/dL   HCT 09.4 (L) 70.9 - 62.8 %   MCV 82.9 80.0 - 100.0 fL   MCH 27.5 26.0 - 34.0 pg   MCHC 33.1 30.0 - 36.0 g/dL   RDW 36.6 29.4 - 76.5 %   Platelets 187 150 - 400 K/uL   nRBC 0.0 0.0 - 0.2 %    Comment: Performed at [facility], [address], [place], [place] [address]    Assessment:   20 y.o. G2P1011 postpartum day # 1  Plan:    1) Acute blood loss anemia - hemodynamically stable and asymptomatic - po ferrous sulfate  2) Blood Type --/--/A POS (02/03 0602) / Rubella 2.30 (08/19 1138) / Varicella Non-Immune  3) TDAP status up to date  4) Feeding plan : Formula  5)  Education given regarding options for contraception, as well as compatibility with breast feeding if applicable.  Patient is undecided for contraception.  6) Disposition: continue current care   Tresea Mall, CNM [facility] 06/10/2020, 9:46 AM

## 2020-06-11 MED ORDER — HYDROCODONE-ACETAMINOPHEN 5-325 MG PO TABS: 1.0000 | ORAL_TABLET | Freq: Four times a day (QID) | ORAL | 0 refills | Status: DC | PRN

## 2020-06-11 NOTE — Progress Notes (Signed)
Admit Date: 06/09/2020 Today's Date: 06/11/2020  Post Partum Day 2  Subjective:  no complaints, up ad lib, voiding and tolerating PO  Objective: Temp:  [98.2 F (36.8 C)-99.3 F (37.4 C)] 98.2 F (36.8 C) (02/05 0733) Pulse Rate:  [76-86] 76 (02/05 0733) Resp:  [18] 18 (02/05 0733) BP: (124-131)/(75-86) 131/84 (02/05 0733) SpO2:  [100 %] 100 % (02/05 0733)  Physical Exam:  General: alert, cooperative and no distress Lochia: appropriate Uterine Fundus: firm Incision: none DVT Evaluation: No evidence of DVT seen on physical exam.  Recent Labs    06/09/20 0602 06/10/20 0626  HGB 10.9* 9.5*  HCT 32.8* 28.7*    Assessment/Plan: Discharge home, Contraception (discussed all options in detail, will plan f/u discussion in office as she is still unsure), Bottle Feeding and Infant doing well   LOS: 2 days   Letitia Libra [facility] 06/11/2020, 9:01 AM

## 2020-06-11 NOTE — Lactation Note (Signed)
This note was copied from a baby's chart. Lactation Consultation Note  Patient Name: Girl Evelyne Makepeace [id number] Date: 06/11/2020 Reason for consult: Initial assessment;Other (Comment) (RN request) Age:22 hours  LC to room at request of RN to provide education on safe formula prep for infant [place]. Written information provided. We also discussed milk coming in as part of the pregnancy cycle. Mom was unaware. Reviewed strategies to dry milk and hand pumping for relief. LC encouraged mom to offer any EBM to [facility] and reviewed safe human milk handling and benefit of human milk. Parents were appreciative of the information and support. They were provided with the opportunity to ask questions. All concerns were addressed.  Mom to f/u with [facility] on Monday for continued support.   Feeding Mother's Current Feeding Choice: Formula   Discharge Discharge Education: Engorgement and breast care;Other (comment) (safe formula prep) Pump: Manual  Consult Status Consult Status: Complete   Elder Negus, MA IBCLC 06/11/2020, 11:29 AM

## 2020-06-11 NOTE — Progress Notes (Signed)
Reviewed D/C instructions with pt and family. Pt verbalized understanding of teaching. Discharged to home via W/C. Pt to schedule f/u appt.  

## 2020-06-11 NOTE — Discharge Instructions (Signed)
Contraception Choices Contraception refers to things you do or use to prevent pregnancy. It is also called birth control. There are several methods of birth control. Talk to your doctor about the best method for you. Hormonal birth control This kind of birth control uses hormones. Here are some types of hormonal birth control:  A tube that is put under the skin of your arm (implant). The tube can stay in for up to 3 years.  Shots you get every 3 months.  Pills you take every day.  A patch you change 1 time each week for 3 weeks. After that, the patch is taken off for 1 week.  A ring you put in the vagina. The ring is left in for 3 weeks. Then it is taken out of the vagina for 1 week. Then a new ring is put in.  Pills you take after unprotected sex. These are called emergency birth control pills.   Barrier birth control Here are some types of barrier birth control:  A thin covering that is put on the penis before sex (female condom). The covering is thrown away after sex.  A soft, loose covering that is put in the vagina before sex (female condom). The covering is thrown away after sex.  A rubber bowl that sits over the cervix (diaphragm). The bowl must be made for you. The bowl is put into the vagina before sex. The bowl is left in for 6-8 hours after sex. It is taken out within 24 hours.  A small, soft cup that fits over the cervix (cervical cap). The cup must be made for you. The cup should be left in for 6-8 hours after sex. It is taken out within 48 hours.  A sponge that is put into the vagina before sex. It must be left in for at least 6 hours after sex. It must be taken out within 30 hours and thrown away.  A chemical that kills or stops sperm from getting into the womb (uterus). This chemical is called a spermicide. It may be a pill, cream, jelly, or foam to put in the vagina. The chemical should be used at least 10-15 minutes before sex.   IUD birth control IUD means  "intrauterine device." It is put inside the womb. There are two kinds:  Hormone IUD. This kind can stay in the womb for 3-5 years.  Copper IUD. This kind can stay in the womb for 10 years. Permanent birth control Here are some types of permanent birth control:  Surgery to block the fallopian tubes.  Having an insert put into each fallopian tube. This method takes 3 months to work. Other forms of birth control must be used for 3 months.  Surgery to tie off the tubes that carry sperm in men (vasectomy). This method takes 3 months to work. Other forms of birth control must be used for 3 months. Natural planning birth control Here are some types of natural planning birth control:  Not having sex on the days the woman could get pregnant.  Using a calendar: ? To keep track of the length of each menstrual cycle. ? To find out what days pregnancy can happen. ? To plan to not have sex on days when pregnancy can happen.  Watching for signs of ovulation and not having sex during this time. One way the woman can check for ovulation is to check her temperature.  Waiting to have sex until after ovulation. Where to find more information  Centers  for Disease Control and Prevention: [employer] Summary  Contraception, also called birth control, refers to things you do or use to prevent pregnancy.  Hormonal methods of birth control include implants, injections, pills, patches, vaginal rings, and emergency birth control pills.  Barrier methods of birth control can include female condoms, female condoms, diaphragms, cervical caps, sponges, and spermicides.  There are two types of IUD (intrauterine device) birth control. An IUD can be put in a woman's womb to prevent pregnancy for several years.  Permanent birth control can be done through a procedure for males, females, or both. Natural planning means not having sex when the woman could get pregnant.   Postpartum Care After Vaginal Delivery The  following information offers guidance about how to care for yourself from the time you deliver your baby to 6-12 weeks after delivery (postpartum period). If you have problems or questions, contact your health care provider for more specific instructions. Follow these instructions at home: Vaginal bleeding  It is normal to have vaginal bleeding (lochia) after delivery. Wear a sanitary pad for bleeding and discharge. ? During the first week after delivery, the amount and appearance of lochia is often similar to a menstrual period. ? Over the next few weeks, it will gradually decrease to a dry, yellow-brown discharge. ? For most women, lochia stops completely by 4-6 weeks after delivery, but can vary.  Change your sanitary pads frequently. Watch for any changes in your flow, such as: ? A sudden increase in volume. ? A change in color. ? Large blood clots.  If you pass a blood clot from your vagina, save it and call your health care provider. Do not flush blood clots down the toilet before talking with your health care provider.  Do not use tampons or douches until your health care provider approves.  If you are not breastfeeding, your period should return 6-8 weeks after delivery. If you are feeding your baby breast milk only, your period may not return until you stop breastfeeding. Perineal care  Keep the area between the vagina and the anus (perineum) clean and dry. Use medicated pads and pain-relieving sprays and creams as directed.  If you had a surgical cut in the perineum (episiotomy) or a tear, check the area for signs of infection until you are healed. Check for: ? More redness, swelling, or pain. ? Fluid or blood coming from the cut or tear. ? Warmth. ? Pus or a bad smell.  You may be given a squirt bottle to use instead of wiping to clean the perineum area after you use the bathroom. Pat the area gently to dry it.  To relieve pain caused by an episiotomy, a tear, or swollen  veins in the anus (hemorrhoids), take a warm sitz bath 2-3 times a day. In a sitz bath, the warm water should only come up to your hips and cover your buttocks.   Breast care  In the first few days after delivery, your breasts may feel heavy, full, and uncomfortable (breast engorgement). Milk may also leak from your breasts. Ask your health care provider about ways to help relieve the discomfort.  If you are breastfeeding: ? Wear a bra that supports your breasts and fits well. Use breast pads to absorb milk that leaks. ? Keep your nipples clean and dry. Apply creams and ointments as told. ? You may have uterine contractions every time you breastfeed for up to several weeks after delivery. This helps your uterus return to its normal size. ?  If you have any problems with breastfeeding, notify your health care provider or lactation consultant.  If you are not breastfeeding: ? Avoid touching your breasts. Do not squeeze out (express) milk. Doing this can make your breasts produce more milk. ? Wear a good-fitting bra and use cold packs to help with swelling. Intimacy and sexuality  Ask your health care provider when you can engage in sexual activity. This may depend upon: ? Your risk of infection. ? How fast you are healing. ? Your comfort and desire to engage in sexual activity.  You are able to get pregnant after delivery, even if you have not had your period. Talk with your health care provider about methods of birth control (contraception) or family planning if you desire future pregnancies. Medicines  Take over-the-counter and prescription medicines only as told by your health care provider.  Take an over-the-counter stool softener to help ease bowel movements as told by your health care provider.  If you were prescribed an antibiotic medicine, take it as told by your health care provider. Do not stop taking the antibiotic even if you start to feel better.  Review all previous and  current prescriptions to check for possible transfer into breast milk. Activity  Gradually return to your normal activities as told by your health care provider.  Rest as much as possible. Nap while your baby is sleeping. Eating and drinking  Drink enough fluid to keep your urine pale yellow.  To help prevent or relieve constipation, eat high-fiber foods every day.  Choose healthy eating to support breastfeeding or weight loss goals.  Take your prenatal vitamins until your health care provider tells you to stop.   General tips/recommendations  Do not use any products that contain nicotine or tobacco. These products include cigarettes, chewing tobacco, and vaping devices, such as e-cigarettes. If you need help quitting, ask your health care provider.  Do not drink alcohol, especially if you are breastfeeding.  Do not take medications or drugs that are not prescribed to you, especially if you are breastfeeding.  Visit your health care provider for a postpartum checkup within the first 3-6 weeks after delivery.  Complete a comprehensive postpartum visit no later than 12 weeks after delivery.  Keep all follow-up visits for you and your baby. Contact a health care provider if:  You feel unusually sad or worried.  Your breasts become red, painful, or hard.  You have a fever or other signs of an infection.  You have bleeding that is soaking through one pad an hour or you have blood clots.  You have a severe headache that doesn't go away or you have vision changes.  You have nausea and vomiting and are unable to eat or drink anything for 24 hours. Get help right away if:  You have chest pain or difficulty breathing.  You have sudden, severe leg pain.  You faint or have a seizure.  You have thoughts about hurting yourself or your baby. If you ever feel like you may hurt yourself or others, or have thoughts about taking your own life, get help right away. Go to your nearest  emergency department or:  Call your local emergency services (911 in the [place]  The National Suicide Prevention Lifeline at 787-180-6867. This suicide crisis helpline is open 24 hours a day.  Text the Crisis Text Line at 215-651-1181 (in the [place] Summary  The period of time after you deliver your newborn up to 6-12 weeks after delivery is called  the postpartum period.  Keep all follow-up visits for you and your baby.  Review all previous and current prescriptions to check for possible transfer into breast milk.  Contact a health care provider if you feel unusually sad or worried during the postpartum period. This information is not intended to replace advice given to you by your health care provider. Make sure you discuss any questions you have with your health care provider. Document Revised: 01/07/2020 Document Reviewed: 01/07/2020 Elsevier Patient Education  2021 [employer].

## 2020-07-11 ENCOUNTER — Ambulatory Visit (INDEPENDENT_AMBULATORY_CARE_PROVIDER_SITE_OTHER): Payer: Medicaid Other | Admitting: Obstetrics and Gynecology

## 2020-07-11 ENCOUNTER — Encounter: Payer: Self-pay | Admitting: Obstetrics and Gynecology

## 2020-07-11 ENCOUNTER — Other Ambulatory Visit: Payer: Self-pay

## 2020-07-11 NOTE — Progress Notes (Signed)
Obstetrics & Gynecology Office Visit   Chief Complaint  Patient presents with  . Follow-up  Discuss contraception  History of Present Illness: Patient is a 20 y.o. [id number] presenting for contraception consult.  She is currently on none and desiring to start Ortho-Evra patches weekly.  She has a past medical history significant for no contraindication to estrogen.  She specifically denies a history of migraine with aura, chronic hypertension, history of DVT/PE and smoking.  Reported No LMP recorded..     Past Medical History:  Diagnosis Date  . GERD (gastroesophageal reflux disease)   . Ovarian cyst 2016   right    Past Surgical History:  Procedure Laterality Date  . NO PAST SURGERIES      Gynecologic History: No LMP recorded.  Obstetric History: G2P1011  History reviewed. No pertinent family history.  Social History   Socioeconomic History  . Marital status: Single    Spouse name: Not on file  . Number of children: Not on file  . Years of education: Not on file  . Highest education level: Not on file  Occupational History  . Not on file  Tobacco Use  . Smoking status: Never Smoker  . Smokeless tobacco: Never Used  Substance and Sexual Activity  . Alcohol use: No  . Drug use: No  . Sexual activity: Yes    Birth control/protection: None  Other Topics Concern  . Not on file  Social History Narrative  . Not on file   Social Determinants of Health   Financial Resource Strain: Not on file  Food Insecurity: Not on file  Transportation Needs: Not on file  Physical Activity: Not on file  Stress: Not on file  Social Connections: Not on file  Intimate Partner Violence: Not on file    No Known Allergies  Prior to Admission medications   Medication Sig Start Date End Date Taking? Authorizing Provider  ferrous sulfate 325 (65 FE) MG EC tablet Take 325 mg by mouth daily.    [provider]  HYDROcodone-acetaminophen (NORCO/VICODIN) 5-325 MG tablet Take  1 tablet by mouth every 6 (six) hours as needed (breakthrough pain or headaches). 06/11/20   Nadara Mustard, MD  Prenat-FeAsp-Meth-FA-DHA w/o A (PRENATE PIXIE) 10-0.6-0.4-200 MG CAPS Take 1 tablet by mouth daily. 02/02/20   Tresea Mall, CNM    Review of Systems  Constitutional: Negative.   HENT: Negative.   Eyes: Negative.   Respiratory: Negative.   Cardiovascular: Negative.   Gastrointestinal: Negative.   Genitourinary: Negative.   Musculoskeletal: Negative.   Skin: Negative.   Neurological: Negative.   Psychiatric/Behavioral: Negative.      Physical Exam BP 118/74   Wt 191 lb (86.6 kg)   BMI 33.83 kg/m  No LMP recorded. Physical Exam Constitutional:      General: She is not in acute distress.    Appearance: Normal appearance.  HENT:     Head: Normocephalic and atraumatic.  Eyes:     General: No scleral icterus.    Conjunctiva/sclera: Conjunctivae normal.  Neurological:     General: No focal deficit present.     Mental Status: She is alert and oriented to person, place, and time.     Cranial Nerves: No cranial nerve deficit.  Psychiatric:        Mood and Affect: Mood normal.        Behavior: Behavior normal.        Judgment: Judgment normal.     Female chaperone present for pelvic and breast  portions of the physical exam  Assessment: 20 y.o. G30P1011 female here for  1. Postpartum care and examination      Plan: Problem List Items Addressed This Visit   None   Visit Diagnoses    Postpartum care and examination    -  Primary     Reviewed all forms of birth control options available including abstinence; over the counter/barrier methods; hormonal contraceptive medication including pill, patch, ring, injection,contraceptive implant; hormonal and nonhormonal IUDs; permanent sterilization options including vasectomy and the various tubal sterilization modalities. Risks and benefits reviewed.  Questions were answered.  She will start the patch at 6 weeks  postpartum. She will use condoms in the mean time, if she chooses to have intercourse. She was discouraged from doing so.    Thomasene Mohair, MD 07/11/2020 2:23 PM

## 2020-08-01 ENCOUNTER — Ambulatory Visit: Payer: Medicaid Other | Admitting: Obstetrics and Gynecology

## 2020-08-17 ENCOUNTER — Ambulatory Visit: Payer: Medicaid Other | Admitting: Obstetrics and Gynecology

## 2020-08-22 ENCOUNTER — Ambulatory Visit: Payer: Medicaid Other | Admitting: Obstetrics and Gynecology

## 2020-09-09 ENCOUNTER — Ambulatory Visit: Payer: Medicaid Other | Admitting: Obstetrics and Gynecology

## 2021-03-09 ENCOUNTER — Other Ambulatory Visit: Payer: Self-pay

## 2021-03-09 ENCOUNTER — Ambulatory Visit
Admit: 2021-03-09 | Discharge: 2021-03-09 | Disposition: A | Discharge status: Home / Self Care | Payer: Medicaid Other | Attending: *Deleted | Admitting: *Deleted

## 2021-03-09 ENCOUNTER — Ambulatory Visit
Admission: EM | Admit: 2021-03-09 | Discharge: 2021-03-09 | Disposition: A | Discharge status: Home / Self Care | Payer: Medicaid Other | Attending: Physician Assistant | Admitting: Physician Assistant

## 2021-03-09 ENCOUNTER — Telehealth: Payer: Self-pay | Admitting: Physician Assistant

## 2021-03-09 ENCOUNTER — Telehealth (HOSPITAL_COMMUNITY): Payer: Self-pay | Admitting: Emergency Medicine

## 2021-03-09 ENCOUNTER — Ambulatory Visit (INDEPENDENT_AMBULATORY_CARE_PROVIDER_SITE_OTHER): Payer: Medicaid Other

## 2021-03-09 DIAGNOSIS — R102 Pelvic and perineal pain: Secondary | ICD-10-CM | POA: Insufficient documentation

## 2021-03-09 DIAGNOSIS — N760 Acute vaginitis: Secondary | ICD-10-CM | POA: Diagnosis not present

## 2021-03-09 DIAGNOSIS — R1032 Left lower quadrant pain: Secondary | ICD-10-CM | POA: Diagnosis not present

## 2021-03-09 DIAGNOSIS — R109 Unspecified abdominal pain: Secondary | ICD-10-CM

## 2021-03-09 DIAGNOSIS — R1012 Left upper quadrant pain: Secondary | ICD-10-CM | POA: Diagnosis not present

## 2021-03-09 LAB — WET PREP, GENITAL
Sperm: NONE SEEN
Trich, Wet Prep: NONE SEEN
WBC, Wet Prep HPF POC: NONE SEEN
Yeast Wet Prep HPF POC: NONE SEEN

## 2021-03-09 LAB — URINALYSIS, COMPLETE (UACMP) WITH MICROSCOPIC
Bilirubin Urine: NEGATIVE
Glucose, UA: NEGATIVE mg/dL
Ketones, ur: NEGATIVE mg/dL
Leukocytes,Ua: NEGATIVE
Nitrite: NEGATIVE
Protein, ur: NEGATIVE mg/dL
Specific Gravity, Urine: 1.025 (ref 1.005–1.030)
pH: 6 (ref 5.0–8.0)

## 2021-03-09 LAB — CHLAMYDIA/NGC RT PCR (ARMC ONLY)
Chlamydia Tr: DETECTED — AB
N gonorrhoeae: NOT DETECTED

## 2021-03-09 LAB — PREGNANCY, URINE: Preg Test, Ur: NEGATIVE

## 2021-03-09 MED ORDER — PANTOPRAZOLE SODIUM 20 MG PO TBEC: 20.0000 mg | DELAYED_RELEASE_TABLET | Freq: Every day | ORAL | 0 refills | Status: DC

## 2021-03-09 MED ORDER — METRONIDAZOLE 500 MG PO TABS: 500.0000 mg | ORAL_TABLET | Freq: Two times a day (BID) | ORAL | 0 refills | Status: AC

## 2021-03-09 MED ORDER — KETOROLAC TROMETHAMINE 60 MG/2ML IM SOLN
60.0000 mg | Freq: Once | INTRAMUSCULAR | Status: AC
  Administered 2021-03-09: 60 mg via INTRAMUSCULAR

## 2021-03-09 MED ORDER — ALUM & MAG HYDROXIDE-SIMETH 200-200-20 MG/5ML PO SUSP
30.0000 mL | Freq: Once | ORAL | Status: AC
  Administered 2021-03-09: 30 mL via ORAL

## 2021-03-09 MED ORDER — KETOROLAC TROMETHAMINE 10 MG PO TABS: 10.0000 mg | ORAL_TABLET | Freq: Three times a day (TID) | ORAL | 0 refills | Status: DC | PRN

## 2021-03-09 MED ORDER — DOXYCYCLINE HYCLATE 100 MG PO CAPS: 100.0000 mg | ORAL_CAPSULE | Freq: Two times a day (BID) | ORAL | 0 refills | Status: AC

## 2021-03-09 MED ORDER — LIDOCAINE VISCOUS HCL 2 % MT SOLN
15.0000 mL | Freq: Once | OROMUCOSAL | Status: AC
  Administered 2021-03-09: 15 mL via ORAL

## 2021-03-09 MED ORDER — FLUCONAZOLE 150 MG PO TABS: ORAL_TABLET | ORAL | 0 refills | Status: DC

## 2021-03-09 NOTE — ED Triage Notes (Signed)
Pt c/o left side abdominal pain, started last night and unable to sleep. It is a constant pain that has not subsided.

## 2021-03-09 NOTE — Discharge Instructions (Signed)
-  We will call and let you know when to return for the ultrasound. - I sent metronidazole and Diflucan to the pharmacy for your vaginal infection -We have given you a injection of pain medication in clinic and a GI cocktail to help settle your stomach in case this is related to reflux at all. - If any of your symptoms acutely worsen before he can get the ultrasound, please go to the emergency department.

## 2021-03-09 NOTE — Telephone Encounter (Signed)
Reviewed results of pelvic ultrasound with patient.  Pelvic ultrasound was within normal limits.  Patient reports that she has 0 pain.  States that the Toradol completely relieved her pain and she was able to take a nap.  Advised patient I will send a short supply of p.o. Toradol for her to the pharmacy.  Patient does have a history of GERD so I have sent pantoprazole so as to hopefully not flareup her GERD.  Advise she can also take Tylenol.  Her lower abdominal pain likely related to menstrual cramping and possibly to acute vaginitis.  I did thoroughly review ED precautions with patient advised her to the ER if she develops a fever or has any worse abdominal or pelvic pain.  Patient agrees to plan.

## 2021-03-09 NOTE — ED Provider Notes (Signed)
[facility]    CSN: [id number] Arrival date & time: 03/09/21  0859      History   Chief Complaint Chief Complaint  Patient presents with   Abdominal Pain    HPI Sabrina Singh is a 20 y.o. female presenting for left upper and lower abdominal pain/left pelvic pain since yesterday.  She says it is moderate to severe.  It is aching.  Pain does not radiate.  Denies any flank pain.  No fever, fatigue, body aches, nausea/vomiting/diarrhea, constipation, dark or bloody stools, vaginal discharge/itching or odor.  Admits to potentially having some urinary frequency but denies urgency or dysuria.  Denies any significant concern for STIs.  States she has been with her current partner for 3 years.  Patient currently on her menstrual period.  States that she should be coming off of it soon.  She has tried Aleve and Tylenol for pain yesterday but says it did not help.  Patient reports history of ovarian cyst about 6 years ago.  She also has a history of GERD.  No other complaints.  HPI  Past Medical History:  Diagnosis Date   GERD (gastroesophageal reflux disease)    Ovarian cyst 2016   right    Patient Active Problem List   Diagnosis Date Noted   Encounter for elective induction of labor 06/09/2020   [redacted] weeks gestation of pregnancy 06/09/2020   Positive GBS test 06/02/2020   Supervision of normal pregnancy 12/09/2019   Susceptible to varicella (non-immune), currently pregnant 11/11/2019    Past Surgical History:  Procedure Laterality Date   NO PAST SURGERIES      OB History     Gravida  2   Para  1   Term  1   Preterm      AB  1   Living  1      SAB  1   IAB      Ectopic      Multiple  0   Live Births  1            Home Medications    Prior to Admission medications   Medication Sig Start Date End Date Taking? Authorizing Provider  fluconazole (DIFLUCAN) 150 MG tablet Take 1 tab PO q72h prn yeast infection 03/09/21  Yes Laurene Footman B, PA-C   metroNIDAZOLE (FLAGYL) 500 MG tablet Take 1 tablet (500 mg total) by mouth 2 (two) times daily for 7 days. 03/09/21 03/16/21 Yes Laurene Footman B, PA-C  ferrous sulfate 325 (65 FE) MG EC tablet Take 325 mg by mouth daily.    [provider]  HYDROcodone-acetaminophen (NORCO/VICODIN) 5-325 MG tablet Take 1 tablet by mouth every 6 (six) hours as needed (breakthrough pain or headaches). 06/11/20   Gae Dry, MD  ketorolac (TORADOL) 10 MG tablet Take 1 tablet (10 mg total) by mouth every 8 (eight) hours as needed. 03/09/21   Danton Clap, PA-C  pantoprazole (PROTONIX) 20 MG tablet Take 1 tablet (20 mg total) by mouth daily for 15 days. 03/09/21 03/24/21  Laurene Footman B, PA-C  Prenat-FeAsp-Meth-FA-DHA w/o A (PRENATE PIXIE) 10-0.6-0.4-200 MG CAPS Take 1 tablet by mouth daily. 02/02/20   Rod Can, CNM    Family History No family history on file.  Social History Social History   Tobacco Use   Smoking status: Never   Smokeless tobacco: Never  Substance Use Topics   Alcohol use: No   Drug use: No     Allergies  Patient has no known allergies.   Review of Systems Review of Systems  Constitutional:  Negative for appetite change, fatigue and fever.  HENT:  Negative for congestion.   Respiratory:  Negative for cough.   Cardiovascular:  Negative for chest pain.  Gastrointestinal:  Positive for abdominal pain. Negative for anal bleeding, blood in stool, constipation, diarrhea, nausea, rectal pain and vomiting.  Genitourinary:  Positive for frequency and pelvic pain. Negative for difficulty urinating, dysuria, flank pain, hematuria, urgency and vaginal discharge.  Musculoskeletal:  Negative for back pain.  Neurological:  Negative for weakness.    Physical Exam Triage Vital Signs ED Triage Vitals  Enc Vitals Group     BP 03/09/21 0943 119/89     Pulse Rate 03/09/21 0943 71     Resp 03/09/21 0943 16     Temp 03/09/21 0943 98.5 F (36.9 C)     Temp Source 03/09/21  0943 Oral     SpO2 03/09/21 0943 100 %     Weight 03/09/21 0942 200 lb (90.7 kg)     Height 03/09/21 0942 5\' 3"  (1.6 m)     Head Circumference --      Peak Flow --      Pain Score 03/09/21 0941 9     Pain Loc --      Pain Edu? --      Excl. in [place]? --    No data found.  Updated Vital Signs BP 119/89 (BP Location: Left Arm)   Pulse 71   Temp 98.5 F (36.9 C) (Oral)   Resp 16   Ht 5\' 3"  (1.6 m)   Wt 200 lb (90.7 kg)   LMP 03/06/2021 (Exact Date) Comment: neg HCG preg test  SpO2 100%   BMI 35.43 kg/m     Physical Exam Vitals and nursing note reviewed.  Constitutional:      General: She is not in acute distress.    Appearance: Normal appearance. She is well-developed. She is not ill-appearing or toxic-appearing.     Comments: She does appear to be in pain, holding left side of abdomen  HENT:     Head: Normocephalic and atraumatic.     Nose: Nose normal.     Mouth/Throat:     Mouth: Mucous membranes are moist.     Pharynx: Oropharynx is clear.  Eyes:     General: No scleral icterus.       Right eye: No discharge.        Left eye: No discharge.     Conjunctiva/sclera: Conjunctivae normal.  Cardiovascular:     Rate and Rhythm: Normal rate and regular rhythm.     Heart sounds: Normal heart sounds.  Pulmonary:     Effort: Pulmonary effort is normal. No respiratory distress.     Breath sounds: Normal breath sounds.  Abdominal:     General: Abdomen is protuberant. Bowel sounds are normal.     Palpations: Abdomen is soft.     Tenderness: There is abdominal tenderness in the left upper quadrant and left lower quadrant.  Musculoskeletal:     Cervical back: Neck supple.  Skin:    General: Skin is dry.  Neurological:     General: No focal deficit present.     Mental Status: She is alert. Mental status is at baseline.     Motor: No weakness.     Gait: Gait normal.  Psychiatric:        Mood and Affect: Mood normal.  Behavior: Behavior normal.        Thought  Content: Thought content normal.     UC Treatments / Results  Labs (all labs ordered are listed, but only abnormal results are displayed) Labs Reviewed  CHLAMYDIA/NGC RT PCR ([facility] ONLY)           - Abnormal; Notable for the following components:      Result Value   Chlamydia Tr DETECTED (*)    All other components within normal limits  WET PREP, GENITAL - Abnormal; Notable for the following components:   Clue Cells Wet Prep HPF POC PRESENT (*)    All other components within normal limits  URINALYSIS, COMPLETE (UACMP) WITH MICROSCOPIC - Abnormal; Notable for the following components:   Hgb urine dipstick TRACE (*)    Bacteria, UA FEW (*)    All other components within normal limits  PREGNANCY, URINE    EKG   Radiology DG Abdomen 1 View  Result Date: 03/09/2021 CLINICAL DATA:  Left-sided abdominal pain beginning last night. EXAM: ABDOMEN - 1 VIEW COMPARISON:  08/26/2016 FINDINGS: No evidence of ileus or obstruction. Moderate amount of fecal matter and stool within the colon, within the range of normal. No abnormal calcifications or bone findings. IMPRESSION: Radiographs within normal limits. Electronically Signed   By: Nelson Chimes M.D.   On: 03/09/2021 11:34   US PELVIC COMPLETE WITH TRANSVAGINAL  Result Date: 03/09/2021 CLINICAL DATA:  Pelvic pain on left side. EXAM: TRANSABDOMINAL AND TRANSVAGINAL ULTRASOUND OF PELVIS TECHNIQUE: Both transabdominal and transvaginal ultrasound examinations of the pelvis were performed. Transabdominal technique was performed for global imaging of the pelvis including uterus, ovaries, adnexal regions, and pelvic cul-de-sac. It was necessary to proceed with endovaginal exam following the transabdominal exam to visualize the ovaries. COMPARISON:  07/29/2015 FINDINGS: Uterus Measurements: 8.4 x 4.1 x 6.5 cm = volume: 118 mL. No fibroids or other mass visualized. Endometrium Thickness: 0.7 cm.  No focal abnormality visualized. Right ovary Measurements: 3.4  x 3.1 x 2.7 cm = volume: 15 mL. Normal appearance/no adnexal mass. Left ovary Measurements: 3.7 x 2.3 x 1.9 cm = volume: 9 mL. Normal appearance/no adnexal mass. Other findings No abnormal free fluid. IMPRESSION: Normal pelvic ultrasound. Electronically Signed   By: Markus Daft M.D.   On: 03/09/2021 15:29    Procedures Procedures (including critical care time)  Medications Ordered in UC Medications  alum & mag hydroxide-simeth (MAALOX/MYLANTA) 200-200-20 MG/5ML suspension 30 mL (30 mLs Oral Given 03/09/21 1041)    And  lidocaine (XYLOCAINE) 2 % viscous mouth solution 15 mL (15 mLs Oral Given 03/09/21 1042)  ketorolac (TORADOL) injection 60 mg (60 mg Intramuscular Given 03/09/21 1139)    Initial Impression / Assessment and Plan / UC Course  I have reviewed the triage vital signs and the nursing notes.  Pertinent labs & imaging results that were available during my care of the patient were reviewed by me and considered in my medical decision making (see chart for details).  20 year old female presenting for onset of left upper and lower abdominal pain yesterday.  Pain is worse in the left lower abdomen and left pelvic region.  Denies any associated symptoms at all.  No fever, nausea/vomiting/diarrhea/constipation, urinary symptoms, vaginal discharge or lesions.  Vitals are normal and stable.  She is afebrile.  She does appear to be having some pain and discomfort.  Patient holding the left mid-abdomen in mild distress.  Urine pregnancy is negative. UA shows trace hgb. Wet prep  + Clue  cells. GC/chlam obtained.  KUB is WNL.  Patient given GI cocktail and 60 mg IM ketorolac in clinic to see if that would help pain possibly related to GERD/gastritis. Pain did go down from 9/10 to 7/10.  Pelvic US ordered to look for ovarian cyst or other GYN problem. Patient to return this afternoon for outpatient [place], but I did give her strict ED precautions in case symptoms worsen before she had the ultrasound  performed.  We will treat BV with metronidazole.  Patient reports that she gets yeast infections with antibiotics so I have also sent Diflucan.  Patient returned to have pelvic ultrasound.  Patient sent home and advised she will be contacted with the results.  1600: Reviewed results of pelvic ultrasound with patient.  Pelvic ultrasound was within normal limits.  Patient reports that she has 0 pain.  States that the Toradol completely relieved her pain and she was able to take a nap.  Advised patient I will send a short supply of p.o. Toradol for her to the pharmacy.  Patient does have a history of GERD so I have sent pantoprazole so as to hopefully not flareup her GERD.  Advise she can also take Tylenol.  Her lower abdominal pain likely related to menstrual cramping and possibly to acute vaginitis.  I did thoroughly review ED precautions with patient advised her to the ER if she develops a fever or has any worse abdominal or pelvic pain.  Patient agrees to plan.  Final Clinical Impressions(s) / UC Diagnoses   Final diagnoses:  Left lower quadrant abdominal pain  Left upper quadrant abdominal pain  Pelvic pain  Acute vaginitis     Discharge Instructions      -We will call and let you know when to return for the ultrasound. - I sent metronidazole and Diflucan to the pharmacy for your vaginal infection -We have given you a injection of pain medication in clinic and a GI cocktail to help settle your stomach in case this is related to reflux at all. - If any of your symptoms acutely worsen before he can get the ultrasound, please go to the emergency department.     ED Prescriptions     Medication Sig Dispense Auth. Provider   metroNIDAZOLE (FLAGYL) 500 MG tablet Take 1 tablet (500 mg total) by mouth 2 (two) times daily for 7 days. 14 tablet Eusebio Friendly B, PA-C   fluconazole (DIFLUCAN) 150 MG tablet Take 1 tab PO q72h prn yeast infection 2 tablet Shirlee Latch, PA-C      PDMP not  reviewed this encounter.   Shirlee Latch, PA-C 03/09/21 [phone]

## 2021-09-12 ENCOUNTER — Ambulatory Visit
Admission: EM | Admit: 2021-09-12 | Discharge: 2021-09-12 | Disposition: A | Discharge status: Home / Self Care | Payer: Medicaid Other | Attending: Physician Assistant | Admitting: Physician Assistant

## 2021-09-12 ENCOUNTER — Other Ambulatory Visit: Payer: Self-pay

## 2021-09-12 ENCOUNTER — Encounter: Payer: Self-pay | Admitting: Emergency Medicine

## 2021-09-12 DIAGNOSIS — N898 Other specified noninflammatory disorders of vagina: Secondary | ICD-10-CM | POA: Diagnosis present

## 2021-09-12 DIAGNOSIS — B3731 Acute candidiasis of vulva and vagina: Secondary | ICD-10-CM | POA: Diagnosis present

## 2021-09-12 LAB — URINALYSIS, ROUTINE W REFLEX MICROSCOPIC
Bilirubin Urine: NEGATIVE
Glucose, UA: NEGATIVE mg/dL
Hgb urine dipstick: NEGATIVE
Ketones, ur: NEGATIVE mg/dL
Leukocytes,Ua: NEGATIVE
Nitrite: NEGATIVE
Protein, ur: NEGATIVE mg/dL
Specific Gravity, Urine: 1.025 (ref 1.005–1.030)
pH: 7 (ref 5.0–8.0)

## 2021-09-12 LAB — WET PREP, GENITAL
Clue Cells Wet Prep HPF POC: NONE SEEN
Sperm: NONE SEEN
Trich, Wet Prep: NONE SEEN
WBC, Wet Prep HPF POC: <10 — AB (ref <10)

## 2021-09-12 LAB — PREGNANCY, URINE: Preg Test, Ur: NEGATIVE

## 2021-09-12 MED ORDER — FLUCONAZOLE 150 MG PO TABS: ORAL_TABLET | ORAL | 0 refills | Status: DC

## 2021-09-12 NOTE — ED Provider Notes (Signed)
?[facility] ? ? ? ?CSN: [id number] ?Arrival date & time: 09/12/21  1648 ? ? ?  ? ?History   ?Chief Complaint ?Chief Complaint  ?Patient presents with  ? Vaginal Discharge  ? ? ?HPI ?Sabrina Singh is a 21 y.o. female.  ? ?21 year old female pt, Sabrina Singh, presents to UC with complaint of yellow/green discharge x 1 week prior with urinary frequency.  Patient states she had chlamydia in past but was treated for that, and this does not feel the same as she does not have any abdominal pain,denies new sexual partners.  Patient states she has been on her period since April 25 and uses tampons.  Patient denies any abdominal pain,nausea,vomiting or fever. ? ?The history is provided by the patient. No language interpreter was used.  ? ?Past Medical History:  ?Diagnosis Date  ? GERD (gastroesophageal reflux disease)   ? Ovarian cyst 2016  ? right  ? ? ?Patient Active Problem List  ? Diagnosis Date Noted  ? Vaginal discharge 09/12/2021  ? Vaginal yeast infection 09/12/2021  ? Encounter for elective induction of labor 06/09/2020  ? [redacted] weeks gestation of pregnancy 06/09/2020  ? Positive GBS test 06/02/2020  ? Supervision of normal pregnancy 12/09/2019  ? Susceptible to varicella (non-immune), currently pregnant 11/11/2019  ? ? ?Past Surgical History:  ?Procedure Laterality Date  ? NO PAST SURGERIES    ? ? ?OB History   ? ? Gravida  ?2  ? Para  ?1  ? Term  ?1  ? Preterm  ?   ? AB  ?1  ? Living  ?1  ?  ? ? SAB  ?1  ? IAB  ?   ? Ectopic  ?   ? Multiple  ?0  ? Live Births  ?1  ?   ?  ?  ? ? ? ?Home Medications   ? ?Prior to Admission medications   ?Medication Sig Start Date End Date Taking? Authorizing Provider  ?Cranberry 300 MG tablet Take 300 mg by mouth 2 (two) times daily.   Yes [provider]  ?esomeprazole (NEXIUM) 20 MG packet Take 20 mg by mouth daily before breakfast.   Yes [provider]  ?VITAMIN D, CHOLECALCIFEROL, PO Take by mouth.   Yes [provider]  ?ferrous sulfate 325  (65 FE) MG EC tablet Take 325 mg by mouth daily.    [provider]  ?fluconazole (DIFLUCAN) 150 MG tablet Take 1 tab PO q72h prn yeast infection 09/12/21   Lovene Maret, Para March, NP  ?HYDROcodone-acetaminophen (NORCO/VICODIN) 5-325 MG tablet Take 1 tablet by mouth every 6 (six) hours as needed (breakthrough pain or headaches). 06/11/20   Nadara Mustard, MD  ?ketorolac (TORADOL) 10 MG tablet Take 1 tablet (10 mg total) by mouth every 8 (eight) hours as needed. 03/09/21   Shirlee Latch, PA-C  ?pantoprazole (PROTONIX) 20 MG tablet Take 1 tablet (20 mg total) by mouth daily for 15 days. 03/09/21 09/12/21  Shirlee Latch, PA-C  ?Prenat-FeAsp-Meth-FA-DHA w/o A (PRENATE PIXIE) 10-0.6-0.4-200 MG CAPS Take 1 tablet by mouth daily. 02/02/20   Tresea Mall, CNM  ? ? ?Family History ?No family history on file. ? ?Social History ?Social History  ? ?Tobacco Use  ? Smoking status: Never  ? Smokeless tobacco: Never  ?Vaping Use  ? Vaping Use: Never used  ?Substance Use Topics  ? Alcohol use: No  ? Drug use: No  ? ? ? ?Allergies   ?Patient has no known  allergies. ? ? ?Review of Systems ?Review of Systems  ?Constitutional:  Negative for fever.  ?Gastrointestinal:  Negative for abdominal pain, constipation, nausea and vomiting.  ?Genitourinary:  Positive for frequency, vaginal bleeding and vaginal discharge. Negative for difficulty urinating, dysuria, hematuria and vaginal pain.  ?All other systems reviewed and are negative. ? ? ?Physical Exam ?Triage Vital Signs ?ED Triage Vitals  ?Enc Vitals Group  ?   BP 09/12/21 1710 118/73  ?   Pulse Rate 09/12/21 1710 76  ?   Resp 09/12/21 1710 16  ?   Temp 09/12/21 1710 98.3 ?F (36.8 ?C)  ?   Temp Source 09/12/21 1710 Oral  ?   SpO2 09/12/21 1710 100 %  ?   Weight 09/12/21 1706 199 lb 15.3 oz (90.7 kg)  ?   Height 09/12/21 1706 5\' 3"  (1.6 m)  ?   Head Circumference --   ?   Peak Flow --   ?   Pain Score 09/12/21 1706 0  ?   Pain Loc --   ?   Pain Edu? --   ?   Excl. in [place]? --   ? ?No data  found. ? ?Updated Vital Signs ?BP 118/73 (BP Location: Left Arm)   Pulse 76   Temp 98.3 ?F (36.8 ?C) (Oral)   Resp 16   Ht 5\' 3"  (1.6 m)   Wt 199 lb 15.3 oz (90.7 kg)   LMP 08/25/2021   SpO2 100%   BMI 35.42 kg/m?  ? ?Visual Acuity ?Right Eye Distance:   ?Left Eye Distance:   ?Bilateral Distance:   ? ?Right Eye Near:   ?Left Eye Near:    ?Bilateral Near:    ? ?Physical Exam ?Vitals and nursing note reviewed.  ?Constitutional:   ?   General: She is not in acute distress. ?   Appearance: She is well-developed and well-groomed.  ?HENT:  ?   Head: Normocephalic and atraumatic.  ?Eyes:  ?   Conjunctiva/sclera: Conjunctivae normal.  ?Cardiovascular:  ?   Rate and Rhythm: Normal rate and regular rhythm.  ?   Pulses: Normal pulses.  ?   Heart sounds: Normal heart sounds. No murmur heard. ?Pulmonary:  ?   Effort: Pulmonary effort is normal. No respiratory distress.  ?   Breath sounds: Normal breath sounds and air entry.  ?Abdominal:  ?   General: Bowel sounds are normal.  ?   Palpations: Abdomen is soft.  ?   Tenderness: There is no abdominal tenderness.  ?Musculoskeletal:     ?   General: No swelling.  ?   Cervical back: Neck supple.  ?Skin: ?   General: Skin is warm and dry.  ?   Capillary Refill: Capillary refill takes less than 2 seconds.  ?Neurological:  ?   General: No focal deficit present.  ?   Mental Status: She is alert and oriented to person, place, and time.  ?   GCS: GCS eye subscore is 4. GCS verbal subscore is 5. GCS motor subscore is 6.  ?Psychiatric:     ?   Attention and Perception: Attention normal.     ?   Mood and Affect: Mood normal.     ?   Speech: Speech normal.     ?   Behavior: Behavior normal. Behavior is cooperative.  ? ? ? ?UC Treatments / Results  ?Labs ?(all labs ordered are listed, but only abnormal results are displayed) ?Labs Reviewed  ?WET PREP, GENITAL - Abnormal; Notable for  the following components:  ?    Result Value  ? Yeast Wet Prep HPF POC PRESENT (*)   ? WBC, Wet Prep HPF  POC <10 (*)   ? All other components within normal limits  ?URINALYSIS, ROUTINE W REFLEX MICROSCOPIC  ?PREGNANCY, URINE  ?CERVICOVAGINAL ANCILLARY ONLY  ? ? ?EKG ? ? ?Radiology ?No results found. ? ?Procedures ?Procedures (including critical care time) ? ?Medications Ordered in UC ?Medications - No data to display ? ?Initial Impression / Assessment and Plan / UC Course  ?I have reviewed the triage vital signs and the nursing notes. ? ?Pertinent labs & imaging results that were available during my care of the patient were reviewed by me and considered in my medical decision making (see chart for details). ? ? Advised pt against excessive use of tampons and need to change tampons regularly to avoid toxic shock syndrome.  Patient verbalized understanding this provider.  We will treat for yeast infection patient awaiting STI lab results will check in MyChart. ? ?Ddx: Yeast vaginitis, UTI, STI ?Final Clinical Impressions(s) / UC Diagnoses  ? ?Final diagnoses:  ?Vaginal discharge  ?Vaginal yeast infection  ? ? ? ?Discharge Instructions   ? ?  ?Take diflucan as directed, your urine preg was negative. Your UA was negative, STI testing is pending. Check my chart for results.  ? ? ? ? ?ED Prescriptions   ? ? Medication Sig Dispense Auth. Provider  ? fluconazole (DIFLUCAN) 150 MG tablet Take 1 tab PO q72h prn yeast infection 2 tablet Jamyson Jirak, Para March, NP  ? ?  ? ?PDMP not reviewed this encounter. ?  ?Clancy Gourd, NP ?09/12/21 2009 ? ?

## 2021-09-12 NOTE — ED Triage Notes (Signed)
Pt c/o yellowish/green vaginal discharge, urinary frequency. Started about a week ago. Denies itching, irritation or burning.   ?

## 2021-09-12 NOTE — Discharge Instructions (Signed)
Take diflucan as directed, your urine preg was negative. Your UA was negative, STI testing is pending. Check my chart for results.  ?

## 2021-09-13 LAB — CERVICOVAGINAL ANCILLARY ONLY
Chlamydia: NEGATIVE
Comment: NEGATIVE
Comment: NORMAL
Neisseria Gonorrhea: NEGATIVE

## 2022-05-09 ENCOUNTER — Ambulatory Visit
Admission: EM | Admit: 2022-05-09 | Discharge: 2022-05-09 | Disposition: A | Discharge status: Home / Self Care | Payer: Medicaid Other | Attending: Emergency Medicine | Admitting: Emergency Medicine

## 2022-05-09 ENCOUNTER — Other Ambulatory Visit: Payer: Self-pay

## 2022-05-09 DIAGNOSIS — J029 Acute pharyngitis, unspecified: Secondary | ICD-10-CM | POA: Insufficient documentation

## 2022-05-09 DIAGNOSIS — H66001 Acute suppurative otitis media without spontaneous rupture of ear drum, right ear: Secondary | ICD-10-CM | POA: Diagnosis present

## 2022-05-09 LAB — GROUP A STREP BY PCR: Group A Strep by PCR: NOT DETECTED

## 2022-05-09 MED ORDER — IPRATROPIUM BROMIDE 0.06 % NA SOLN: 2.0000 | Freq: Four times a day (QID) | NASAL | 12 refills | Status: DC

## 2022-05-09 MED ORDER — AMOXICILLIN-POT CLAVULANATE 875-125 MG PO TABS: 1.0000 | ORAL_TABLET | Freq: Two times a day (BID) | ORAL | 0 refills | Status: AC

## 2022-05-09 NOTE — ED Triage Notes (Signed)
Pt states sick since Christmas. Started out with respiratory infection around Christmas along with a sore throat. Sx have improved but still has sore throat and ears hurt.

## 2022-05-09 NOTE — ED Provider Notes (Signed)
[facility]    CSN: [id number] Arrival date & time: 05/09/22  1813      History   Chief Complaint Chief Complaint  Patient presents with   Otalgia   Sore Throat    HPI Sabrina Singh is a 22 y.o. female.   HPI  22 year old female here for evaluation of sore throat and ear pain.  Patient reports that she was sick with an upper respiratory infection before Christmas and she continues to have pain in both of her ears as well as a sore throat.  She denies any fever, runny nose, or cough.  She does endorse some mild nasal congestion.  Past Medical History:  Diagnosis Date   GERD (gastroesophageal reflux disease)    Ovarian cyst 2016   right    Patient Active Problem List   Diagnosis Date Noted   Vaginal discharge 09/12/2021   Vaginal yeast infection 09/12/2021   Encounter for elective induction of labor 06/09/2020   [redacted] weeks gestation of pregnancy 06/09/2020   Positive GBS test 06/02/2020   Supervision of normal pregnancy 12/09/2019   Susceptible to varicella (non-immune), currently pregnant 11/11/2019    Past Surgical History:  Procedure Laterality Date   NO PAST SURGERIES      OB History     Gravida  2   Para  1   Term  1   Preterm      AB  1   Living  1      SAB  1   IAB      Ectopic      Multiple  0   Live Births  1            Home Medications    Prior to Admission medications   Medication Sig Start Date End Date Taking? Authorizing Provider  amoxicillin-clavulanate (AUGMENTIN) 875-125 MG tablet Take 1 tablet by mouth every 12 (twelve) hours for 10 days. 05/09/22 05/19/22 Yes Margarette Canada, NP  ipratropium (ATROVENT) 0.06 % nasal spray Place 2 sprays into both nostrils 4 (four) times daily. 05/09/22  Yes Margarette Canada, NP  omeprazole (PRILOSEC OTC) 20 MG tablet Take 20 mg by mouth daily.   Yes [provider]  pantoprazole (PROTONIX) 20 MG tablet Take 1 tablet (20 mg total) by mouth daily for 15 days. 03/09/21 09/12/21   Danton Clap, PA-C    Family History History reviewed. No pertinent family history.  Social History Social History   Tobacco Use   Smoking status: Never   Smokeless tobacco: Never  Vaping Use   Vaping Use: Never used  Substance Use Topics   Alcohol use: Yes    Comment: weekends   Drug use: No     Allergies   Patient has no known allergies.   Review of Systems Review of Systems  Constitutional:  Negative for fever.  HENT:  Positive for congestion, ear pain and sore throat. Negative for rhinorrhea.   Respiratory:  Negative for cough.      Physical Exam Triage Vital Signs ED Triage Vitals  Enc Vitals Group     BP 05/09/22 1855 114/71     Pulse Rate 05/09/22 1855 88     Resp 05/09/22 1855 17     Temp 05/09/22 1855 98.3 F (36.8 C)     Temp Source 05/09/22 1855 Oral     SpO2 05/09/22 1855 100 %     Weight 05/09/22 1851 210 lb (95.3 kg)     Height 05/09/22 1851  5\' 3"  (1.6 m)     Head Circumference --      Peak Flow --      Pain Score 05/09/22 1851 6     Pain Loc --      Pain Edu? --      Excl. in [place]? --    No data found.  Updated Vital Signs BP 114/71 (BP Location: Left Arm)   Pulse 88   Temp 98.3 F (36.8 C) (Oral)   Resp 17   Ht 5\' 3"  (1.6 m)   Wt 210 lb (95.3 kg)   LMP 04/20/2022   SpO2 100%   BMI 37.20 kg/m   Visual Acuity Right Eye Distance:   Left Eye Distance:   Bilateral Distance:    Right Eye Near:   Left Eye Near:    Bilateral Near:     Physical Exam Vitals and nursing note reviewed.  Constitutional:      Appearance: Normal appearance. She is not ill-appearing.  HENT:     Head: Normocephalic and atraumatic.     Right Ear: Ear canal and external ear normal. There is no impacted cerumen.     Left Ear: Tympanic membrane, ear canal and external ear normal. There is no impacted cerumen.     Ears:     Comments: Right tympanic membrane is erythematous and injected.  Left TM is pearly gray in appearance.  Both external auditory  canals have mild amount of cerumen.    Nose: Congestion present. No rhinorrhea.     Comments: Nasal mucosa is erythematous and edematous.  No discharge appreciated.    Mouth/Throat:     Mouth: Mucous membranes are moist.     Pharynx: Oropharynx is clear. Posterior oropharyngeal erythema present. No oropharyngeal exudate.     Comments: Patient has moderate erythema to the posterior oropharynx but no injection appreciated.  No tonsillar edema or exudate noted. Musculoskeletal:     Cervical back: Normal range of motion and neck supple.  Lymphadenopathy:     Cervical: No cervical adenopathy.  Skin:    General: Skin is warm and dry.     Capillary Refill: Capillary refill takes less than 2 seconds.  Neurological:     General: No focal deficit present.     Mental Status: She is alert and oriented to person, place, and time.  Psychiatric:        Mood and Affect: Mood normal.        Behavior: Behavior normal.        Thought Content: Thought content normal.        Judgment: Judgment normal.      UC Treatments / Results  Labs (all labs ordered are listed, but only abnormal results are displayed) Labs Reviewed  GROUP A STREP BY PCR    EKG   Radiology No results found.  Procedures Procedures (including critical care time)  Medications Ordered in UC Medications - No data to display  Initial Impression / Assessment and Plan / UC Course  I have reviewed the triage vital signs and the nursing notes.  Pertinent labs & imaging results that were available during my care of the patient were reviewed by me and considered in my medical decision making (see chart for details).   Patient is a nontoxic-appearing 56 old female here for evaluation of 10 days worth of sore throat, nasal congestion, and bilateral ear pain.  Before Christmas she had an upper respiratory infection and states that the majority of her symptoms resolved  with exception of those listed above.  She has not run a fever.   Her physical exam does reveal the presence of otitis media in the right ear as well as continued nasal congestion.  Strep PCR was collected at triage and is pending.  I will start her on Augmentin 875 twice daily for 10 days for treatment of otitis media.  This will also cover any potential strep pharyngitis that shows up on the swab.  Additionally, I will treat her nasal mucosa with Atrovent nasal spray, 2 squirts up each nostril every 6 hours, as needed for nasal congestion.  Return precautions reviewed.   Final Clinical Impressions(s) / UC Diagnoses   Final diagnoses:  Non-recurrent acute suppurative otitis media of right ear without spontaneous rupture of tympanic membrane  Pharyngitis, unspecified etiology     Discharge Instructions      Take the Augmentin twice daily for 10 days with food for treatment of your ear infection.  Take an over-the-counter probiotic 1 hour after each dose of antibiotic to prevent diarrhea.  Use over-the-counter Tylenol and ibuprofen as needed for pain or fever.  Place a hot water bottle, or heating pad, underneath your pillowcase at night to help dilate up your ear and aid in pain relief as well as resolution of the infection.  Use the Atrovent nasal spray, 2 squirts in each nostril every 6 hours as needed for congestion.  Return for reevaluation for any new or worsening symptoms.      ED Prescriptions     Medication Sig Dispense Auth. Provider   amoxicillin-clavulanate (AUGMENTIN) 875-125 MG tablet Take 1 tablet by mouth every 12 (twelve) hours for 10 days. 20 tablet Margarette Canada, NP   ipratropium (ATROVENT) 0.06 % nasal spray Place 2 sprays into both nostrils 4 (four) times daily. 15 mL Margarette Canada, NP      PDMP not reviewed this encounter.   Margarette Canada, NP 05/09/22 [phone]

## 2022-05-09 NOTE — Discharge Instructions (Signed)
Take the Augmentin twice daily for 10 days with food for treatment of your ear infection.  Take an over-the-counter probiotic 1 hour after each dose of antibiotic to prevent diarrhea.  Use over-the-counter Tylenol and ibuprofen as needed for pain or fever.  Place a hot water bottle, or heating pad, underneath your pillowcase at night to help dilate up your ear and aid in pain relief as well as resolution of the infection.  Use the Atrovent nasal spray, 2 squirts in each nostril every 6 hours as needed for congestion.  Return for reevaluation for any new or worsening symptoms.

## 2022-06-28 ENCOUNTER — Encounter: Payer: Self-pay | Admitting: Emergency Medicine

## 2022-06-28 ENCOUNTER — Ambulatory Visit
Admission: EM | Admit: 2022-06-28 | Discharge: 2022-06-28 | Disposition: A | Discharge status: Home / Self Care | Payer: Medicaid Other | Attending: Emergency Medicine | Admitting: Emergency Medicine

## 2022-06-28 DIAGNOSIS — B3731 Acute candidiasis of vulva and vagina: Secondary | ICD-10-CM | POA: Insufficient documentation

## 2022-06-28 DIAGNOSIS — B9689 Other specified bacterial agents as the cause of diseases classified elsewhere: Secondary | ICD-10-CM | POA: Diagnosis not present

## 2022-06-28 DIAGNOSIS — N760 Acute vaginitis: Secondary | ICD-10-CM | POA: Diagnosis present

## 2022-06-28 LAB — WET PREP, GENITAL
Sperm: NONE SEEN
Trich, Wet Prep: NONE SEEN
WBC, Wet Prep HPF POC: <10 (ref <10)

## 2022-06-28 MED ORDER — METRONIDAZOLE 500 MG PO TABS: 500.0000 mg | ORAL_TABLET | Freq: Two times a day (BID) | ORAL | 0 refills | Status: DC

## 2022-06-28 MED ORDER — FLUCONAZOLE 150 MG PO TABS: 150.0000 mg | ORAL_TABLET | Freq: Every day | ORAL | 0 refills | Status: AC

## 2022-06-28 NOTE — ED Triage Notes (Signed)
Pt presents with right side groin pain that started yesterday. The pain is similar to when she had chlamydia and she wants STD testing. She also has some vaginal discharge.

## 2022-06-28 NOTE — ED Provider Notes (Signed)
[facility]    CSN: [id number] Arrival date & time: 06/28/22  [id number]      History   Chief Complaint Chief Complaint  Patient presents with   SEXUALLY TRANSMITTED DISEASE    Entered by patient   Abdominal Pain    HPI Sabrina Singh is a 22 y.o. female.   Patient presents for evaluation of right lower pelvic pain occurring intermittently for 1 day, described as cramping.  Has noticed change to normal discharge with red streaks beginning 1 day ago.  Has not attempted treatment.  Sexually active, 3 partners, no condom use, no known exposure.  Denies urinary symptoms, vaginal itching, irritation or odor, new rash or lesions, fevers.  History of an ovarian cyst.  Past Medical History:  Diagnosis Date   GERD (gastroesophageal reflux disease)    Ovarian cyst 2016   right    Patient Active Problem List   Diagnosis Date Noted   Vaginal discharge 09/12/2021   Vaginal yeast infection 09/12/2021   Encounter for elective induction of labor 06/09/2020   [redacted] weeks gestation of pregnancy 06/09/2020   Positive GBS test 06/02/2020   Supervision of normal pregnancy 12/09/2019   Susceptible to varicella (non-immune), currently pregnant 11/11/2019    Past Surgical History:  Procedure Laterality Date   NO PAST SURGERIES      OB History     Gravida  2   Para  1   Term  1   Preterm      AB  1   Living  1      SAB  1   IAB      Ectopic      Multiple  0   Live Births  1            Home Medications    Prior to Admission medications   Medication Sig Start Date End Date Taking? Authorizing Provider  omeprazole (PRILOSEC OTC) 20 MG tablet Take 20 mg by mouth daily.   Yes [provider]  ipratropium (ATROVENT) 0.06 % nasal spray Place 2 sprays into both nostrils 4 (four) times daily. 05/09/22   Margarette Canada, NP  pantoprazole (PROTONIX) 20 MG tablet Take 1 tablet (20 mg total) by mouth daily for 15 days. 03/09/21 09/12/21  Danton Clap, PA-C     Family History No family history on file.  Social History Social History   Tobacco Use   Smoking status: Never   Smokeless tobacco: Never  Vaping Use   Vaping Use: Never used  Substance Use Topics   Alcohol use: Yes    Comment: weekends   Drug use: No     Allergies   Patient has no known allergies.   Review of Systems Review of Systems  Constitutional: Negative.   HENT: Negative.    Respiratory: Negative.    Cardiovascular: Negative.   Genitourinary:  Positive for pelvic pain and vaginal discharge. Negative for decreased urine volume, difficulty urinating, dyspareunia, dysuria, enuresis, flank pain, frequency, genital sores, hematuria, menstrual problem, urgency, vaginal bleeding and vaginal pain.  Skin: Negative.      Physical Exam Triage Vital Signs ED Triage Vitals  Enc Vitals Group     BP 06/28/22 1002 111/67     Pulse Rate 06/28/22 1002 86     Resp 06/28/22 1002 16     Temp 06/28/22 1002 98.5 F (36.9 C)     Temp Source 06/28/22 1002 Oral     SpO2 06/28/22 1002 100 %  Weight --      Height --      Head Circumference --      Peak Flow --      Pain Score 06/28/22 1000 0     Pain Loc --      Pain Edu? --      Excl. in [place]? --    No data found.  Updated Vital Signs BP 111/67 (BP Location: Left Arm)   Pulse 86   Temp 98.5 F (36.9 C) (Oral)   Resp 16   LMP 06/11/2022 (Approximate)   SpO2 100%   Visual Acuity Right Eye Distance:   Left Eye Distance:   Bilateral Distance:    Right Eye Near:   Left Eye Near:    Bilateral Near:     Physical Exam Constitutional:      Appearance: Normal appearance.  Eyes:     Extraocular Movements: Extraocular movements intact.  Pulmonary:     Effort: Pulmonary effort is normal.  Abdominal:     General: Abdomen is flat. Bowel sounds are normal. There is no distension.     Palpations: Abdomen is soft.     Tenderness: There is no abdominal tenderness. There is no right CVA tenderness, left CVA  tenderness or guarding.  Neurological:     Mental Status: She is alert and oriented to person, place, and time. Mental status is at baseline.      UC Treatments / Results  Labs (all labs ordered are listed, but only abnormal results are displayed) Labs Reviewed  WET PREP, GENITAL - Abnormal; Notable for the following components:      Result Value   Yeast Wet Prep HPF POC PRESENT (*)    Clue Cells Wet Prep HPF POC PRESENT (*)    All other components within normal limits  CERVICOVAGINAL ANCILLARY ONLY    EKG   Radiology No results found.  Procedures Procedures (including critical care time)  Medications Ordered in UC Medications - No data to display  Initial Impression / Assessment and Plan / UC Course  I have reviewed the triage vital signs and the nursing notes.  Pertinent labs & imaging results that were available during my care of the patient were reviewed by me and considered in my medical decision making (see chart for details).   Yeast vaginitis, bacterial vaginosis  Confirmed on wet prep, negative for trichomoniasis, prescribed metronidazole and Diflucan and discussed administration, advised avoidance of alcohol during use of medication, gonorrhea and chlamydia testing pending, advised abstinence until lab results, treatment is complete and all symptoms have resolved, may follow-up with his urgent care as needed if symptoms persist or recur Final Clinical Impressions(s) / UC Diagnoses   Final diagnoses:  None   Discharge Instructions   None    ED Prescriptions   None    PDMP not reviewed this encounter.   Hans Eden, NP 06/28/22 1054

## 2022-06-28 NOTE — Discharge Instructions (Addendum)
Today you are being treated for  Bacterial vaginosis and yeast confirmed on your, negative for trichomoniasis  Take Metronidazole 500 mg twice a day for 7 days, do not drink alcohol while using medication, this will make you feel sick   Take 1 Diflucan tablet when you receive your medications and take second tablet after completing all antibiotics  Bacterial vaginosis which results from an overgrowth of one on several organisms that are normally present in your vagina. Vaginosis is an inflammation of the vagina that can result in discharge, itching and pain.  Yeast infections which are caused by a naturally occurring fungus called candida. Vaginosis is an inflammation of the vagina that can result in discharge, itching and pain. The cause is usually a change in the normal balance of vaginal bacteria or an infection. Vaginosis can also result from reduced estrogen levels after menopause.  Labs pending  you will be contacted if positive for any sti and treatment will be sent to the pharmacy, you will have to return to the clinic if positive for gonorrhea to receive treatment   Please refrain from having sex until labs results, if positive please refrain from having sex until treatment complete and symptoms resolve   If positive for  Chlamydia  gonorrhea please notify partner or partners so they may tested as well  Moving forward, it is recommended you use some form of protection against the transmission of sti infections  such as condoms or dental dams with each sexual encounter     In addition: Avoid baths, hot tubs and whirlpool spas.  Don't use scented or harsh soaps Avoid irritants. These include scented tampons and pads. Wipe from front to back after using the toilet. Don't douche. Your vagina doesn't require cleansing other than normal bathing.  Use a condom.  Wear cotton underwear, this fabric absorbs some moisture.

## 2022-06-29 LAB — CERVICOVAGINAL ANCILLARY ONLY
Chlamydia: NEGATIVE
Comment: NEGATIVE
Comment: NORMAL
Neisseria Gonorrhea: NEGATIVE

## 2022-12-02 ENCOUNTER — Encounter: Payer: Self-pay | Admitting: Emergency Medicine

## 2022-12-02 ENCOUNTER — Ambulatory Visit
Admission: EM | Admit: 2022-12-02 | Discharge: 2022-12-02 | Disposition: A | Discharge status: Home / Self Care | Payer: Medicaid Other | Attending: Family Medicine | Admitting: Family Medicine

## 2022-12-02 DIAGNOSIS — N760 Acute vaginitis: Secondary | ICD-10-CM

## 2022-12-02 DIAGNOSIS — B9689 Other specified bacterial agents as the cause of diseases classified elsewhere: Secondary | ICD-10-CM | POA: Diagnosis not present

## 2022-12-02 DIAGNOSIS — R519 Headache, unspecified: Secondary | ICD-10-CM | POA: Diagnosis not present

## 2022-12-02 DIAGNOSIS — K219 Gastro-esophageal reflux disease without esophagitis: Secondary | ICD-10-CM | POA: Diagnosis not present

## 2022-12-02 LAB — WET PREP, GENITAL
Sperm: NONE SEEN
Trich, Wet Prep: NONE SEEN
WBC, Wet Prep HPF POC: >10 — AB (ref <10)
Yeast Wet Prep HPF POC: NONE SEEN

## 2022-12-02 LAB — URINALYSIS, W/ REFLEX TO CULTURE (INFECTION SUSPECTED)
Bilirubin Urine: NEGATIVE
Glucose, UA: NEGATIVE mg/dL
Hgb urine dipstick: NEGATIVE
Ketones, ur: NEGATIVE mg/dL
Leukocytes,Ua: NEGATIVE
Nitrite: NEGATIVE
Protein, ur: NEGATIVE mg/dL
RBC / HPF: NONE SEEN RBC/hpf (ref 0–5)
Specific Gravity, Urine: 1.025 (ref 1.005–1.030)
WBC, UA: NONE SEEN WBC/hpf (ref 0–5)
pH: 6.5 (ref 5.0–8.0)

## 2022-12-02 LAB — PREGNANCY, URINE: Preg Test, Ur: NEGATIVE

## 2022-12-02 MED ORDER — OMEPRAZOLE MAGNESIUM 20 MG PO TBEC: 40.0000 mg | DELAYED_RELEASE_TABLET | Freq: Every day | ORAL | 3 refills | Status: AC

## 2022-12-02 MED ORDER — OMEPRAZOLE MAGNESIUM 20 MG PO TBEC: 20.0000 mg | DELAYED_RELEASE_TABLET | Freq: Every day | ORAL | 3 refills | Status: DC

## 2022-12-02 MED ORDER — METHOCARBAMOL 500 MG PO TABS: 500.0000 mg | ORAL_TABLET | Freq: Two times a day (BID) | ORAL | 0 refills | Status: AC

## 2022-12-02 MED ORDER — NAPROXEN 500 MG PO TABS: 500.0000 mg | ORAL_TABLET | Freq: Two times a day (BID) | ORAL | 0 refills | Status: AC

## 2022-12-02 MED ORDER — METRONIDAZOLE 500 MG PO TABS: 500.0000 mg | ORAL_TABLET | Freq: Two times a day (BID) | ORAL | 0 refills | Status: DC

## 2022-12-02 NOTE — ED Provider Notes (Signed)
[facility]    CSN: [id number] Arrival date & time: 12/02/22  0907      History   Chief Complaint Chief Complaint  Patient presents with   Headache     HPI HPI Sabrina Singh is a 22 y.o. female.    Sabrina Singh presents for headache for the past 3 days. Took Excedrin and BC powder which help but she doesn't want to have to keep taking it. Her headache typically approves with sleep. Has pressure under around her eyes. Headaches come and go but now there all the time. Has some chest discomfort but has been out of her GERD medication.   Nausea/vomiting: no  Photophobia/phonophobia: yes, light  Tearing of eyes: no  Sinus pain/pressure: no  Family hx migraine: yes  Personal stressors: yes  Relation to menstrual cycle: no   Fever: no  Neck pain/stiffness: no  Vision/speech/swallow/hearing difficulty: no  Focal weakness/numbness: no  Altered mental status: no  Trauma: no  New type of headache: no  Anticoagulant use: no  H/o cancer/HIV/Pregnancy: no    Has vaginal odor and her cycle has not come on this month. Patient's last menstrual period was 10/22/2022 (approximate). She has been more stress recently. Tried nothing for this prior to arrival.  Has  not had any antibiotics in last 30 days.   Denies known STI exposure.  Sabrina Singh does not use condoms regularly. She is not known to be currently pregnant.  Patient's last menstrual period was 10/22/2022 (approximate).       Past Medical History:  Diagnosis Date   GERD (gastroesophageal reflux disease)    Ovarian cyst 2016   right    Patient Active Problem List   Diagnosis Date Noted   Vaginal discharge 09/12/2021   Vaginal yeast infection 09/12/2021   Encounter for elective induction of labor 06/09/2020   [redacted] weeks gestation of pregnancy 06/09/2020   Positive GBS test 06/02/2020   Supervision of normal pregnancy 12/09/2019   Susceptible to varicella (non-immune), currently pregnant 11/11/2019     Past Surgical History:  Procedure Laterality Date   NO PAST SURGERIES      OB History     Gravida  2   Para  1   Term  1   Preterm      AB  1   Living  1      SAB  1   IAB      Ectopic      Multiple  0   Live Births  1            Home Medications    Prior to Admission medications   Medication Sig Start Date End Date Taking? Authorizing Provider  methocarbamol (ROBAXIN) 500 MG tablet Take 1 tablet (500 mg total) by mouth 2 (two) times daily. 12/02/22  Yes Johnnay Pleitez, DO  naproxen (NAPROSYN) 500 MG tablet Take 1 tablet (500 mg total) by mouth 2 (two) times daily with a meal. 12/02/22  Yes Evie Croston, DO  metroNIDAZOLE (FLAGYL) 500 MG tablet Take 1 tablet (500 mg total) by mouth 2 (two) times daily. 12/02/22   Katha Cabal, DO  omeprazole (PRILOSEC OTC) 20 MG tablet Take 2 tablets (40 mg total) by mouth daily. 12/02/22   Katha Cabal, DO    Family History History reviewed. No pertinent family history.  Social History Social History   Tobacco Use   Smoking status: Never   Smokeless tobacco: Never  Vaping Use   Vaping status: Never  Used  Substance Use Topics   Alcohol use: Yes    Comment: weekends   Drug use: No     Allergies   Patient has no known allergies.   Review of Systems Review of Systems: :negative unless otherwise stated in HPI.      Physical Exam Triage Vital Signs ED Triage Vitals  Encounter Vitals Group     BP 12/02/22 0922 120/70     Systolic BP Percentile --      Diastolic BP Percentile --      Pulse Rate 12/02/22 0922 76     Resp 12/02/22 0922 14     Temp 12/02/22 0922 99.2 F (37.3 C)     Temp Source 12/02/22 0922 Oral     SpO2 12/02/22 0922 100 %     Weight 12/02/22 0920 215 lb (97.5 kg)     Height 12/02/22 0920 5\' 3"  (1.6 m)     Head Circumference --      Peak Flow --      Pain Score 12/02/22 0920 5     Pain Loc --      Pain Education --      Exclude from Growth Chart --    No data  found.  Updated Vital Signs BP 120/70 (BP Location: Left Arm)   Pulse 76   Temp 99.2 F (37.3 C) (Oral)   Resp 14   Ht 5\' 3"  (1.6 m)   Wt 97.5 kg   LMP 10/22/2022 (Approximate)   SpO2 100%   Breastfeeding No   BMI 38.09 kg/m   Visual Acuity Right Eye Distance:   Left Eye Distance:   Bilateral Distance:    Right Eye Near:   Left Eye Near:    Bilateral Near:     Physical Exam GEN: well appearing female in no acute distress  CVS: well perfused, regular rate and rhythm RESP: speaking in full sentences without pause, clear bilaterally  ABD: soft, non-tender, non-distended, no palpable masses   GU: deferred, patient performed self swab  NEURO:  alert, oriented, speech normal, CN 2-12 grossly intact, no facial droop,  sensation grossly intact, strength 5/5 bilateral UE and LE, normal coordination,  normal gait SKIN: warm, dry   UC Treatments / Results  Labs (all labs ordered are listed, but only abnormal results are displayed) Labs Reviewed  WET PREP, GENITAL - Abnormal; Notable for the following components:      Result Value   Clue Cells Wet Prep HPF POC PRESENT (*)    WBC, Wet Prep HPF POC >10 (*)    All other components within normal limits  URINALYSIS, W/ REFLEX TO CULTURE (INFECTION SUSPECTED) - Abnormal; Notable for the following components:   Bacteria, UA RARE (*)    All other components within normal limits  PREGNANCY, URINE  CERVICOVAGINAL ANCILLARY ONLY    EKG   Radiology No results found.  Procedures Procedures (including critical care time)  Medications Ordered in UC Medications - No data to display  Initial Impression / Assessment and Plan / UC Course  I have reviewed the triage vital signs and the nursing notes.  Pertinent labs & imaging results that were available during my care of the patient were reviewed by me and considered in my medical decision making (see chart for details).      Patient is a 22 y.o.Marland Kitchen female  who presents for  headache and vaginal concerns.  Overall patient is well-appearing and afebrile.  Vital signs stable.  Differential diagnosis for headache includes but is not limited to migraine headache, tension headache, cluster headache, CVA, depression, intracranial hemorrhage, meningitis/encephalitis, giant cell arteritis, idiopathic intracranial hypertension, dehydration and analgesia abuse. There are no red flag symptoms associated with headache.  On chart review, there is no head imaging available.    Vital signs are stable.  Overall, patient is well-appearing, well-hydrated and in no acute distress.  Cardiopulmonary and neuro exams are normal.  She has a lot of life stressors at the moment. She is tearful when discussing them. Treat suspected tension headache with muscle relaxer and naprosyn at home as below. ED and return precautions reviewed. Advised to follow up with PCP.   UA not consistent with acute cystitis.   Hematuria not supported on microscopy.  Wet prep showing evidence of  bacterial vaginitis but no yeast or trichomonas.  Self care instructions given including avoiding douching Gonorrhea and Chlamydia testing obtained.  Patient's last menstrual period was 10/22/2022 (approximate). Urine pregnancy test is negative. Treatment: Flagyl 500 BID x 7 days and advised patient to not drink alcohol while taking this medication. Return precautions including abdominal pain, fever, chills, nausea, or vomiting given.   GERD: acute on chronic; refilled Omeprazole but increase to 40 mg daily.   Discussed MDM, treatment plan and plan for follow-up with patient who agrees with plan.      Final Clinical Impressions(s) / UC Diagnoses   Final diagnoses:  Bacterial vaginitis  Bad headache  Gastroesophageal reflux disease, unspecified whether esophagitis present     Discharge Instructions      Stop by the pharmacy to pick up your prescriptions.  Follow up with your primary care provider as needed.    Your STD test results will be available in the next 72 hours. If positive, someone will contact you.  You should see your results in your MyChart account.   For you headache, take Naprosyn (pain medication) and Robaxin/methocarbamol (muscle relaxer) twice a day as needed.  If headache does not improve or suddenly worsens, go to the emergency department.      ED Prescriptions     Medication Sig Dispense Auth. Provider   metroNIDAZOLE (FLAGYL) 500 MG tablet Take 1 tablet (500 mg total) by mouth 2 (two) times daily. 14 tablet Breyonna Nault, DO   methocarbamol (ROBAXIN) 500 MG tablet Take 1 tablet (500 mg total) by mouth 2 (two) times daily. 20 tablet Ashiya Kinkead, DO   naproxen (NAPROSYN) 500 MG tablet Take 1 tablet (500 mg total) by mouth 2 (two) times daily with a meal. 30 tablet Jonahtan Manseau, DO   omeprazole (PRILOSEC OTC) 20 MG tablet Take 2 tablets (40 mg total) by mouth daily. 30 tablet Katha Cabal, DO      PDMP not reviewed this encounter.   Katha Cabal, DO 12/02/22 1054

## 2022-12-02 NOTE — Discharge Instructions (Addendum)
Stop by the pharmacy to pick up your prescriptions.  Follow up with your primary care provider as needed.   Your STD test results will be available in the next 72 hours. If positive, someone will contact you.  You should see your results in your MyChart account.   For you headache, take Naprosyn (pain medication) and Robaxin/methocarbamol (muscle relaxer) twice a day as needed.  If headache does not improve or suddenly worsens, go to the emergency department.

## 2022-12-02 NOTE — ED Triage Notes (Signed)
Patient reports headache off and on for a week.  Patient reports that her headache has been constant for the past couple of days. Patient reports LMP on 10/22/22.

## 2023-03-27 IMAGING — CR DG ABDOMEN 1V
2 series · 2 of 2 positions shown · non-contrast
Comparison: 08/26/2016

CLINICAL DATA: Left-sided abdominal pain beginning last night.

EXAM:
ABDOMEN - 1 VIEW

[abdomen kub (1 of 2)]
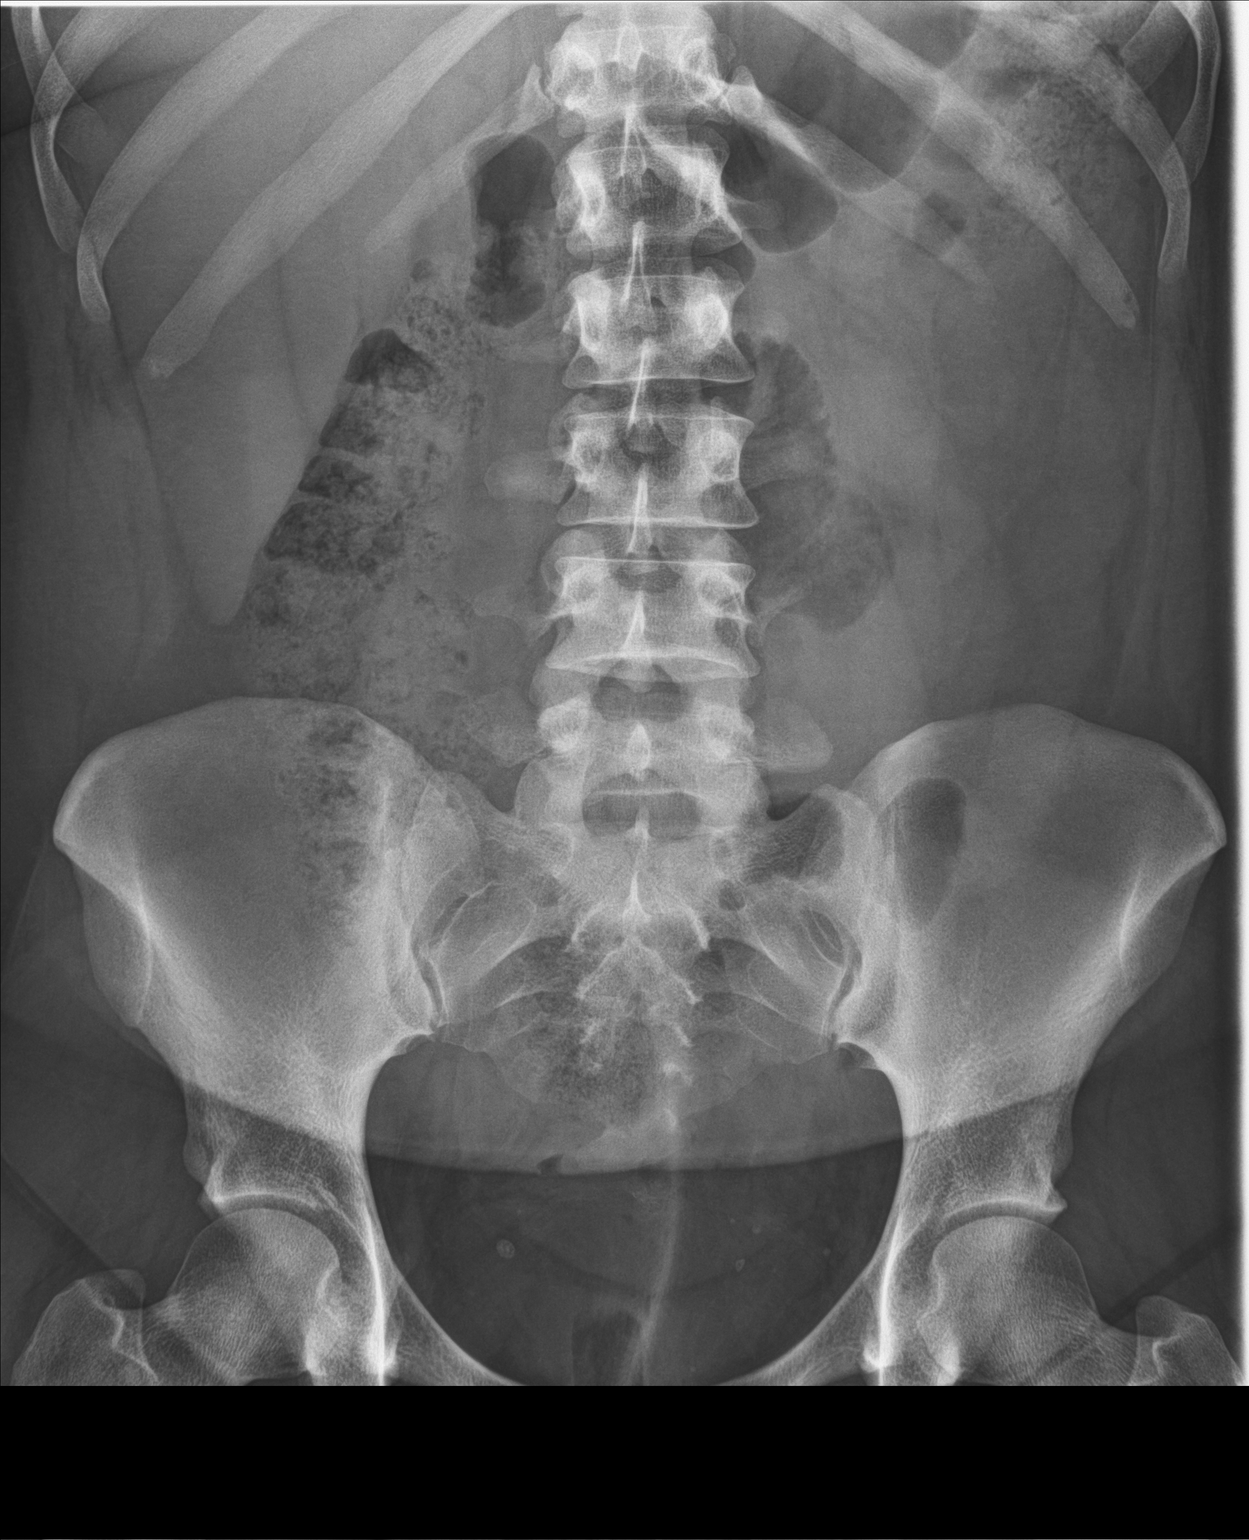

[abdomen kub (2 of 2)]
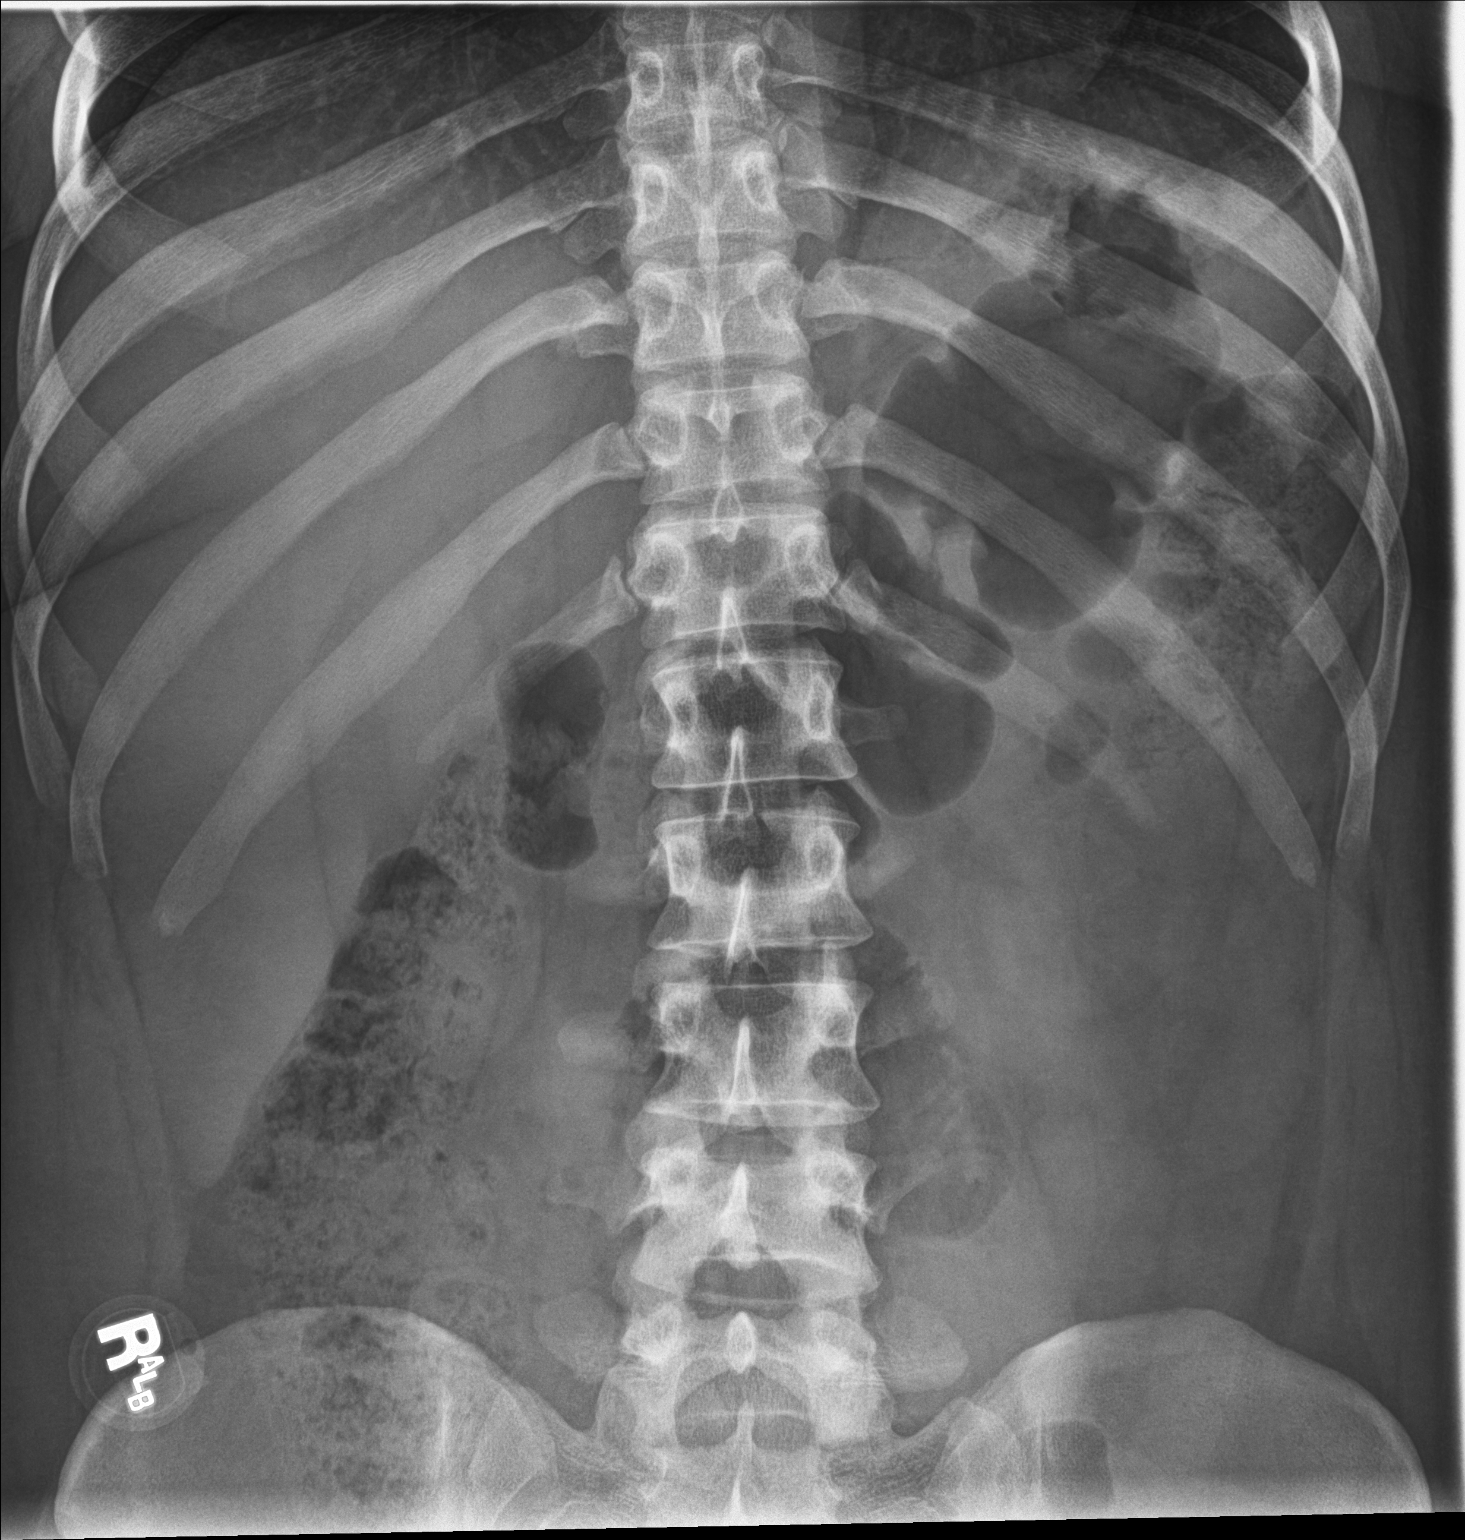

[2 of 2 positions shown; findings below may reference images not displayed]

FINDINGS: No evidence of ileus or obstruction. Moderate amount of fecal matter
and stool within the colon, within the range of normal. No abnormal
calcifications or bone findings.
IMPRESSION: Radiographs within normal limits.

## 2023-04-02 ENCOUNTER — Ambulatory Visit
Admission: EM | Admit: 2023-04-02 | Discharge: 2023-04-02 | Disposition: A | Discharge status: Home / Self Care | Payer: Medicaid Other | Attending: Emergency Medicine | Admitting: Emergency Medicine

## 2023-04-02 ENCOUNTER — Encounter: Payer: Self-pay | Admitting: Emergency Medicine

## 2023-04-02 DIAGNOSIS — J020 Streptococcal pharyngitis: Secondary | ICD-10-CM

## 2023-04-02 LAB — GROUP A STREP BY PCR: Group A Strep by PCR: DETECTED — AB

## 2023-04-02 MED ORDER — AMOXICILLIN 500 MG PO CAPS: 1000.0000 mg | ORAL_CAPSULE | Freq: Two times a day (BID) | ORAL | 0 refills | Status: AC

## 2023-04-02 NOTE — Discharge Instructions (Addendum)
You have strep throat, rest, push fluids, follow up with PCP. May alternate tylenol/ibuprofne as label directed for pain/fever. Take antibiotic as directed. Do not eat or drink after anyone, throw toothbrush away tomorrow, get new one.

## 2023-04-02 NOTE — ED Triage Notes (Signed)
Pt presents with a sore throat, body aches and ear fullness x 3 days.

## 2023-04-02 NOTE — ED Provider Notes (Signed)
[facility]    CSN: [id number] Arrival date & time: 04/02/23  1854      History   Chief Complaint Chief Complaint  Patient presents with   Sore Throat   Ear Fullness   Generalized Body Aches    HPI Sabrina Singh is a 22 y.o. female.   22 year old female, Sabrina Singh, presents to urgent care for evaluation of sore throat, body aches and ear fullness for 3 days. Pt took Mucinex for symptom management.   The history is provided by the patient. No language interpreter was used.    Past Medical History:  Diagnosis Date   GERD (gastroesophageal reflux disease)    Ovarian cyst 2016   right    Patient Active Problem List   Diagnosis Date Noted   Strep pharyngitis 04/02/2023   Vaginal discharge 09/12/2021   Vaginal yeast infection 09/12/2021   Encounter for elective induction of labor 06/09/2020   [redacted] weeks gestation of pregnancy 06/09/2020   Positive GBS test 06/02/2020   Supervision of normal pregnancy 12/09/2019   Susceptible to varicella (non-immune), currently pregnant 11/11/2019    Past Surgical History:  Procedure Laterality Date   NO PAST SURGERIES      OB History     Gravida  2   Para  1   Term  1   Preterm      AB  1   Living  1      SAB  1   IAB      Ectopic      Multiple  0   Live Births  1            Home Medications    Prior to Admission medications   Medication Sig Start Date End Date Taking? Authorizing Provider  amoxicillin (AMOXIL) 500 MG capsule Take 2 capsules (1,000 mg total) by mouth 2 (two) times daily for 10 days. 04/02/23 04/12/23 Yes Madiha Bambrick, Para March, NP  phentermine 37.5 MG capsule Take 37.5 mg by mouth every morning. 04/02/23  Yes [provider]  methocarbamol (ROBAXIN) 500 MG tablet Take 1 tablet (500 mg total) by mouth 2 (two) times daily. 12/02/22   Brimage, Seward Meth, DO  metroNIDAZOLE (FLAGYL) 500 MG tablet Take 1 tablet (500 mg total) by mouth 2 (two) times daily. 12/02/22   Katha Cabal, DO  naproxen (NAPROSYN) 500 MG tablet Take 1 tablet (500 mg total) by mouth 2 (two) times daily with a meal. 12/02/22   Brimage, Vondra, DO  omeprazole (PRILOSEC OTC) 20 MG tablet Take 2 tablets (40 mg total) by mouth daily. 12/02/22   Katha Cabal, DO    Family History No family history on file.  Social History Social History   Tobacco Use   Smoking status: Never   Smokeless tobacco: Never  Vaping Use   Vaping status: Never Used  Substance Use Topics   Alcohol use: Yes    Comment: weekends   Drug use: No     Allergies   Patient has no known allergies.   Review of Systems Review of Systems  Constitutional:  Negative for fever.  HENT:  Positive for ear pain and sore throat. Negative for ear discharge.   Respiratory:  Negative for cough.   Musculoskeletal:  Positive for myalgias.  All other systems reviewed and are negative.    Physical Exam Triage Vital Signs ED Triage Vitals  Encounter Vitals Group     BP      Systolic BP Percentile  Diastolic BP Percentile      Pulse      Resp      Temp      Temp src      SpO2      Weight      Height      Head Circumference      Peak Flow      Pain Score      Pain Loc      Pain Education      Exclude from Growth Chart    No data found.  Updated Vital Signs BP 117/83 (BP Location: Right Arm)   Pulse 95   Temp 98.8 F (37.1 C) (Oral)   Resp 16   LMP 03/12/2023 (Approximate)   SpO2 98%   Visual Acuity Right Eye Distance:   Left Eye Distance:   Bilateral Distance:    Right Eye Near:   Left Eye Near:    Bilateral Near:     Physical Exam Vitals and nursing note reviewed.  Constitutional:      General: She is not in acute distress.    Appearance: She is well-developed and well-groomed.  HENT:     Head: Normocephalic and atraumatic.     Mouth/Throat:     Lips: Pink.     Mouth: Mucous membranes are moist.     Pharynx: Uvula midline. Posterior oropharyngeal erythema present.     Tonsils: No  tonsillar exudate or tonsillar abscesses.  Eyes:     Conjunctiva/sclera: Conjunctivae normal.  Cardiovascular:     Rate and Rhythm: Normal rate and regular rhythm.     Pulses: Normal pulses.     Heart sounds: Normal heart sounds. No murmur heard. Pulmonary:     Effort: Pulmonary effort is normal. No respiratory distress.     Breath sounds: Normal breath sounds and air entry.  Abdominal:     Palpations: Abdomen is soft.     Tenderness: There is no abdominal tenderness.  Musculoskeletal:        General: No swelling.     Cervical back: Neck supple.  Skin:    General: Skin is warm and dry.     Capillary Refill: Capillary refill takes less than 2 seconds.  Neurological:     General: No focal deficit present.     Mental Status: She is alert and oriented to person, place, and time.     GCS: GCS eye subscore is 4. GCS verbal subscore is 5. GCS motor subscore is 6.     Cranial Nerves: No cranial nerve deficit.     Sensory: No sensory deficit.  Psychiatric:        Attention and Perception: Attention normal.        Mood and Affect: Mood normal.        Speech: Speech normal.        Behavior: Behavior normal. Behavior is cooperative.      UC Treatments / Results  Labs (all labs ordered are listed, but only abnormal results are displayed) Labs Reviewed  GROUP A STREP BY PCR - Abnormal; Notable for the following components:      Result Value   Group A Strep by PCR DETECTED (*)    All other components within normal limits    EKG   Radiology No results found.  Procedures Procedures (including critical care time)  Medications Ordered in UC Medications - No data to display  Initial Impression / Assessment and Plan / UC Course  I have reviewed the  triage vital signs and the nursing notes.  Pertinent labs & imaging results that were available during my care of the patient were reviewed by me and considered in my medical decision making (see chart for details).    Discussed  exam findings and plan of care with patient, strict go to ER precautions given.   Patient verbalized understanding to this provider.  Ddx: strep, Viral pharyngitis, viral illness, allergies Final Clinical Impressions(s) / UC Diagnoses   Final diagnoses:  Strep pharyngitis     Discharge Instructions      You have strep throat, rest, push fluids, follow up with PCP. May alternate tylenol/ibuprofne as label directed for pain/fever. Take antibiotic as directed. Do not eat or drink after anyone, throw toothbrush away tomorrow, get new one.      ED Prescriptions     Medication Sig Dispense Auth. Provider   amoxicillin (AMOXIL) 500 MG capsule Take 2 capsules (1,000 mg total) by mouth 2 (two) times daily for 10 days. 40 capsule Buffi Ewton, Para March, NP      PDMP not reviewed this encounter.   Clancy Gourd, NP 04/02/23 [phone]

## 2023-08-15 ENCOUNTER — Ambulatory Visit
Admission: EM | Admit: 2023-08-15 | Discharge: 2023-08-15 | Disposition: A | Discharge status: Home / Self Care | Attending: Family Medicine | Admitting: Family Medicine

## 2023-08-15 ENCOUNTER — Ambulatory Visit (INDEPENDENT_AMBULATORY_CARE_PROVIDER_SITE_OTHER)

## 2023-08-15 DIAGNOSIS — D649 Anemia, unspecified: Secondary | ICD-10-CM | POA: Insufficient documentation

## 2023-08-15 DIAGNOSIS — R1084 Generalized abdominal pain: Secondary | ICD-10-CM

## 2023-08-15 LAB — CBC WITH DIFFERENTIAL/PLATELET
Abs Immature Granulocytes: 0.03 10*3/uL (ref 0.00–0.07)
Basophils Absolute: 0 10*3/uL (ref 0.0–0.1)
Basophils Relative: 0 %
Eosinophils Absolute: 0.1 10*3/uL (ref 0.0–0.5)
Eosinophils Relative: 1 %
HCT: 33.3 % — ABNORMAL LOW (ref 36.0–46.0)
Hemoglobin: 10.7 g/dL — ABNORMAL LOW (ref 12.0–15.0)
Immature Granulocytes: 0 %
Lymphocytes Relative: 29 %
Lymphs Abs: 2.9 10*3/uL (ref 0.7–4.0)
MCH: 26.6 pg (ref 26.0–34.0)
MCHC: 32.1 g/dL (ref 30.0–36.0)
MCV: 82.6 fL (ref 80.0–100.0)
Monocytes Absolute: 0.9 10*3/uL (ref 0.1–1.0)
Monocytes Relative: 9 %
Neutro Abs: 5.9 10*3/uL (ref 1.7–7.7)
Neutrophils Relative %: 61 %
Platelets: 349 10*3/uL (ref 150–400)
RBC: 4.03 MIL/uL (ref 3.87–5.11)
RDW: 15.3 % (ref 11.5–15.5)
WBC: 9.8 10*3/uL (ref 4.0–10.5)
nRBC: 0 % (ref 0.0–0.2)

## 2023-08-15 LAB — URINALYSIS, W/ REFLEX TO CULTURE (INFECTION SUSPECTED)
Bilirubin Urine: NEGATIVE
Glucose, UA: NEGATIVE mg/dL
Hgb urine dipstick: NEGATIVE
Ketones, ur: NEGATIVE mg/dL
Leukocytes,Ua: NEGATIVE
Nitrite: NEGATIVE
Protein, ur: NEGATIVE mg/dL
Specific Gravity, Urine: 1.02 (ref 1.005–1.030)
pH: 7 (ref 5.0–8.0)

## 2023-08-15 LAB — COMPREHENSIVE METABOLIC PANEL WITH GFR
ALT: 20 U/L (ref 0–44)
AST: 22 U/L (ref 15–41)
Albumin: 3.9 g/dL (ref 3.5–5.0)
Alkaline Phosphatase: 51 U/L (ref 38–126)
Anion gap: 5 (ref 5–15)
BUN: 13 mg/dL (ref 6–20)
CO2: 27 mmol/L (ref 22–32)
Calcium: 8.6 mg/dL — ABNORMAL LOW (ref 8.9–10.3)
Chloride: 103 mmol/L (ref 98–111)
Creatinine, Ser: 0.74 mg/dL (ref 0.44–1.00)
GFR, Estimated: >60 mL/min (ref >60)
Glucose, Bld: 91 mg/dL (ref 70–99)
Potassium: 4 mmol/L (ref 3.5–5.1)
Sodium: 135 mmol/L (ref 135–145)
Total Bilirubin: 0.3 mg/dL (ref 0.0–1.2)
Total Protein: 7.8 g/dL (ref 6.5–8.1)

## 2023-08-15 LAB — LIPASE, BLOOD: Lipase: 30 U/L (ref 11–51)

## 2023-08-15 LAB — PREGNANCY, URINE: Preg Test, Ur: NEGATIVE

## 2023-08-15 MED ORDER — LACTULOSE 10 GM/15ML PO SOLN: 10.0000 g | Freq: Three times a day (TID) | ORAL | 0 refills | Status: AC

## 2023-08-15 NOTE — ED Provider Notes (Signed)
 [facility]    CSN: [id number] Arrival date & time: 08/15/23  1733      History   Chief Complaint No chief complaint on file.    HPI HPI Sabrina Singh is a 23 y.o. female.    AIRI COPADO presents for generalized cramping abdominal pain that gets worse after urination since Tuesday.  Has upper abdominal pain that radiates to the lower abdomen. Tried ibuprofen and Midol prior to arrival that didn't help.  Has not had any antibiotics in last 30 days.   Denies known STI exposure.  Sabrina Singh She is  not currently pregnant.  Patient's last menstrual period was 07/25/2023 (approximate).   Has been having soft stools. She ate a salad from [employer] on Tuesday and had diarrhea that night. Her stomach has been hurting since then.   Had some nausea but no vomiting. Denies fever, sore throat, rash,  Endorses thick vaginal discharge.    Abdominal surgeries: None   - Abnormal vaginal discharge: yes thick - vaginal bleeding: no - Dysuria: no - Hematuria: no - Urinary urgency:no  - Urinary frequency: no  - Fever: no - Pelvic pain: no - Rash/Skin lesions/mouth ulcers: no - Nausea: yes - Vomiting: no  - Back Pain:        Past Medical History:  Diagnosis Date   GERD (gastroesophageal reflux disease)    Ovarian cyst 2016   right    Patient Active Problem List   Diagnosis Date Noted   Strep pharyngitis 04/02/2023   Vaginal discharge 09/12/2021   Vaginal yeast infection 09/12/2021   Encounter for elective induction of labor 06/09/2020   [redacted] weeks gestation of pregnancy 06/09/2020   Positive GBS test 06/02/2020   Supervision of normal pregnancy 12/09/2019   Susceptible to varicella (non-immune), currently pregnant 11/11/2019    Past Surgical History:  Procedure Laterality Date   NO PAST SURGERIES      OB History     Gravida  2   Para  1   Term  1   Preterm      AB  1   Living  1      SAB  1   IAB      Ectopic      Multiple  0   Live  Births  1            Home Medications    Prior to Admission medications   Medication Sig Start Date End Date Taking? Authorizing Provider  lactulose (CHRONULAC) 10 GM/15ML solution Take 15 mLs (10 g total) by mouth 3 (three) times daily. 08/15/23  Yes Kaidyn Hernandes, DO  methocarbamol (ROBAXIN) 500 MG tablet Take 1 tablet (500 mg total) by mouth 2 (two) times daily. 12/02/22   Mashelle Busick, DO  metroNIDAZOLE (FLAGYL) 500 MG tablet Take 1 tablet (500 mg total) by mouth 2 (two) times daily. 12/02/22   Deshawn Witty, DO  metroNIDAZOLE (FLAGYL) 500 MG tablet Take 1 tablet (500 mg total) by mouth 2 (two) times daily for 7 days. 08/20/23 08/27/23  Banister, Pamela K, MD  naproxen (NAPROSYN) 500 MG tablet Take 1 tablet (500 mg total) by mouth 2 (two) times daily with a meal. 12/02/22   Dontee Jaso, Alean Amen, DO  omeprazole (PRILOSEC OTC) 20 MG tablet Take 2 tablets (40 mg total) by mouth daily. 12/02/22   Shameeka Silliman, DO  phentermine 37.5 MG capsule Take 37.5 mg by mouth every morning. 04/02/23   [provider]    Family History  No family history on file.  Social History Social History   Tobacco Use   Smoking status: Never   Smokeless tobacco: Never  Vaping Use   Vaping status: Never Used  Substance Use Topics   Alcohol use: Yes    Comment: weekends   Drug use: No     Allergies   Patient has no known allergies.   Review of Systems Review of Systems: :negative unless otherwise stated in HPI.      Physical Exam Triage Vital Signs ED Triage Vitals  Encounter Vitals Group     BP 08/15/23 1757 127/76     Systolic BP Percentile --      Diastolic BP Percentile --      Pulse Rate 08/15/23 1757 73     Resp 08/15/23 1757 18     Temp 08/15/23 1757 98.6 F (37 C)     Temp Source 08/15/23 1757 Oral     SpO2 08/15/23 1757 98 %     Weight 08/15/23 1755 195 lb (88.5 kg)     Height 08/15/23 1755 5\' 3"  (1.6 m)     Head Circumference --      Peak Flow --      Pain Score  08/15/23 1755 7     Pain Loc --      Pain Education --      Exclude from Growth Chart --    No data found.  Updated Vital Signs BP 127/76 (BP Location: Left Arm)   Pulse 73   Temp 98.6 F (37 C) (Oral)   Resp 18   Ht 5\' 3"  (1.6 m)   Wt 88.5 kg   LMP 07/25/2023 (Approximate)   SpO2 98%   BMI 34.54 kg/m   Visual Acuity Right Eye Distance:   Left Eye Distance:   Bilateral Distance:    Right Eye Near:   Left Eye Near:    Bilateral Near:     Physical Exam GEN: well appearing female in no acute distress  CVS: well perfused, regular rate and rhythm RESP: speaking in full sentences without pause  ABD: soft, generalized tenderness, non-distended, no palpable masses, no CVA tenderness, negative McBurney's, negative Murphy's GU: deferred, patient performed self swab  SKIN: Warm and dry    UC Treatments / Results  Labs (all labs ordered are listed, but only abnormal results are displayed) Labs Reviewed  URINALYSIS, W/ REFLEX TO CULTURE (INFECTION SUSPECTED) - Abnormal; Notable for the following components:      Result Value   APPearance HAZY (*)    Bacteria, UA FEW (*)    All other components within normal limits  CBC WITH DIFFERENTIAL/PLATELET - Abnormal; Notable for the following components:   Hemoglobin 10.7 (*)    HCT 33.3 (*)    All other components within normal limits  COMPREHENSIVE METABOLIC PANEL WITH GFR - Abnormal; Notable for the following components:   Calcium 8.6 (*)    All other components within normal limits  CERVICOVAGINAL ANCILLARY ONLY - Abnormal; Notable for the following components:   Bacterial Vaginitis (gardnerella) Positive (*)    All other components within normal limits  PREGNANCY, URINE  LIPASE, BLOOD    EKG   Radiology DG Abd 1 View Result Date: 08/15/2023 CLINICAL DATA:  Abdominal pain for 2 days, dysuria EXAM: ABDOMEN - 1 VIEW COMPARISON:  03/09/2021 FINDINGS: Two supine frontal views of the abdomen and pelvis are obtained. No  bowel obstruction or ileus. No masses or abnormal calcifications. Stable pelvic phleboliths. Lung  bases are clear. No acute bony abnormalities. IMPRESSION: 1. Unremarkable bowel gas pattern.  No radiopaque calculi. Electronically Signed   By: Bobbye Burrow M.D.   On: 08/15/2023 21:32     Procedures Procedures (including critical care time)  Medications Ordered in UC Medications - No data to display  Initial Impression / Assessment and Plan / UC Course  I have reviewed the triage vital signs and the nursing notes.  Pertinent labs & imaging results that were available during my care of the patient were reviewed by me and considered in my medical decision making (see chart for details).      Patient is a 23 y.o.Sabrina Singh female  who presents for abdominal pain for the past 2 days with painful urination overall patient is well-appearing and afebrile.  Vital signs stable.  UA not consistent with acute cystitis.  No hematuria to suggest kidney stones.  Urine pregnancy test is negative.  Lipase and CMP grossly unremarkable.  CBC showing stable normocytic anemia.  There is no leukocytosis to suggest appendicitis, cholecystitis or pancreatitis.  .  Vaginal swab for yeast vaginitis and bacterial vaginitis, trichomonas, gonorrhea and chlamydia obtained.   KUB obtained showing no small bowel obstruction.  There are some fecal lifts near the pelvic outlet she does have some right-sided stool burden.  Will prescribe some lactulose as this may be causing some of her discomfort.  Return precautions including abdominal pain, fever, chills, nausea, or vomiting given. Discussed MDM, treatment plan and plan for follow-up with patient  who agrees with plan.        Final Clinical Impressions(s) / UC Diagnoses   Final diagnoses:  Generalized abdominal pain  Normocytic anemia   Discharge Instructions   None    ED Prescriptions     Medication Sig Dispense Auth. Provider   lactulose (CHRONULAC) 10 GM/15ML  solution Take 15 mLs (10 g total) by mouth 3 (three) times daily. 237 mL Avon Mergenthaler, DO      PDMP not reviewed this encounter.   Lenice Koper, DO 08/21/23 1920

## 2023-08-15 NOTE — ED Triage Notes (Signed)
 Patient presents with abdomen pain x 2 days, hurts when urinate. Treated with Midol and Ibuprofen.

## 2023-08-20 ENCOUNTER — Telehealth (HOSPITAL_COMMUNITY): Payer: Self-pay

## 2023-08-20 LAB — CERVICOVAGINAL ANCILLARY ONLY
Bacterial Vaginitis (gardnerella): POSITIVE — AB
Candida Glabrata: NEGATIVE
Candida Vaginitis: NEGATIVE
Chlamydia: NEGATIVE
Comment: NEGATIVE
Comment: NEGATIVE
Comment: NEGATIVE
Comment: NEGATIVE
Comment: NEGATIVE
Comment: NORMAL
Neisseria Gonorrhea: NEGATIVE
Trichomonas: NEGATIVE

## 2023-08-20 MED ORDER — METRONIDAZOLE 500 MG PO TABS: 500.0000 mg | ORAL_TABLET | Freq: Two times a day (BID) | ORAL | 0 refills | Status: AC

## 2023-08-20 NOTE — Telephone Encounter (Signed)
 Per protocol, pt requires tx with metronidazole. Rx sent to pharmacy on file.

## 2023-09-24 ENCOUNTER — Ambulatory Visit

## 2023-10-07 ENCOUNTER — Encounter: Payer: Self-pay | Admitting: Emergency Medicine

## 2023-10-07 ENCOUNTER — Ambulatory Visit
Admission: EM | Admit: 2023-10-07 | Discharge: 2023-10-07 | Disposition: A | Discharge status: Home / Self Care | Attending: Physician Assistant | Admitting: Physician Assistant

## 2023-10-07 DIAGNOSIS — Z113 Encounter for screening for infections with a predominantly sexual mode of transmission: Secondary | ICD-10-CM | POA: Diagnosis not present

## 2023-10-07 DIAGNOSIS — N898 Other specified noninflammatory disorders of vagina: Secondary | ICD-10-CM | POA: Insufficient documentation

## 2023-10-07 DIAGNOSIS — R350 Frequency of micturition: Secondary | ICD-10-CM | POA: Insufficient documentation

## 2023-10-07 DIAGNOSIS — N760 Acute vaginitis: Secondary | ICD-10-CM | POA: Insufficient documentation

## 2023-10-07 LAB — URINALYSIS, W/ REFLEX TO CULTURE (INFECTION SUSPECTED)
Glucose, UA: NEGATIVE mg/dL
Hgb urine dipstick: NEGATIVE
Ketones, ur: 40 mg/dL — AB
Leukocytes,Ua: NEGATIVE
Nitrite: NEGATIVE
Protein, ur: 30 mg/dL — AB
Specific Gravity, Urine: >1.03 — ABNORMAL HIGH (ref 1.005–1.030)
pH: 5.5 (ref 5.0–8.0)

## 2023-10-07 MED ORDER — FLUCONAZOLE 150 MG PO TABS: ORAL_TABLET | ORAL | 0 refills | Status: AC

## 2023-10-07 MED ORDER — METRONIDAZOLE 0.75 % VA GEL: 1.0000 | Freq: Every day | VAGINAL | 0 refills | Status: AC

## 2023-10-07 NOTE — Discharge Instructions (Addendum)

## 2023-10-07 NOTE — ED Provider Notes (Signed)
 [facility]    CSN: [id number] Arrival date & time: 10/07/23  1724      History   Chief Complaint Chief Complaint  Patient presents with   Vaginal Discharge   vaginal odor     HPI Sabrina Singh is a 23 y.o. female presenting for approximately 2-week history of vaginal itching, odor and discharge.  Patient also requests to have STI screening and urinalysis.  Has had urinary frequency. She denies fever, fatigue, abdominal/pelvic pain, dysuria,urgency, hematuria.  History of frequent BV and yeast infections. New sexual partner.  HPI  Past Medical History:  Diagnosis Date   GERD (gastroesophageal reflux disease)    Ovarian cyst 2016   right    Patient Active Problem List   Diagnosis Date Noted   Strep pharyngitis 04/02/2023   Vaginal discharge 09/12/2021   Vaginal yeast infection 09/12/2021   Encounter for elective induction of labor 06/09/2020   [redacted] weeks gestation of pregnancy 06/09/2020   Positive GBS test 06/02/2020   Supervision of normal pregnancy 12/09/2019   Susceptible to varicella (non-immune), currently pregnant 11/11/2019    Past Surgical History:  Procedure Laterality Date   NO PAST SURGERIES      OB History     Gravida  2   Para  1   Term  1   Preterm      AB  1   Living  1      SAB  1   IAB      Ectopic      Multiple  0   Live Births  1            Home Medications    Prior to Admission medications   Medication Sig Start Date End Date Taking? Authorizing Provider  lactulose  (CHRONULAC ) 10 GM/15ML solution Take 15 mLs (10 g total) by mouth 3 (three) times daily. 08/15/23   Brimage, Vondra, DO  methocarbamol  (ROBAXIN ) 500 MG tablet Take 1 tablet (500 mg total) by mouth 2 (two) times daily. 12/02/22   Brimage, Vondra, DO  metroNIDAZOLE  (FLAGYL ) 500 MG tablet Take 1 tablet (500 mg total) by mouth 2 (two) times daily. 12/02/22   Brimage, Vondra, DO  naproxen  (NAPROSYN ) 500 MG tablet Take 1 tablet (500 mg total) by mouth  2 (two) times daily with a meal. 12/02/22   Brimage, Vondra, DO  omeprazole  (PRILOSEC  OTC) 20 MG tablet Take 2 tablets (40 mg total) by mouth daily. 12/02/22   Brimage, Vondra, DO  phentermine 37.5 MG capsule Take 37.5 mg by mouth every morning. 04/02/23   [provider]    Family History No family history on file.  Social History Social History   Tobacco Use   Smoking status: Never   Smokeless tobacco: Never  Vaping Use   Vaping status: Never Used  Substance Use Topics   Alcohol use: Yes    Comment: weekends   Drug use: No     Allergies   Patient has no known allergies.   Review of Systems Review of Systems  Constitutional:  Negative for fatigue and fever.  Gastrointestinal:  Negative for abdominal pain.  Genitourinary:  Positive for frequency and vaginal discharge. Negative for dysuria, flank pain, hematuria, urgency, vaginal bleeding and vaginal pain.  Musculoskeletal:  Negative for back pain.  Skin:  Negative for rash.     Physical Exam Triage Vital Signs ED Triage Vitals  Encounter Vitals Group     BP      Systolic BP Percentile  Diastolic BP Percentile      Pulse      Resp      Temp      Temp src      SpO2      Weight      Height      Head Circumference      Peak Flow      Pain Score      Pain Loc      Pain Education      Exclude from Growth Chart    No data found.  Updated Vital Signs BP 105/72 (BP Location: Left Arm)   Pulse 92   Temp 99.3 F (37.4 C) (Oral)   Resp 16   LMP 09/25/2023   SpO2 99%       Physical Exam Vitals and nursing note reviewed.  Constitutional:      General: She is not in acute distress.    Appearance: Normal appearance. She is not ill-appearing or toxic-appearing.  HENT:     Head: Normocephalic and atraumatic.  Eyes:     General: No scleral icterus.       Right eye: No discharge.        Left eye: No discharge.     Conjunctiva/sclera: Conjunctivae normal.  Cardiovascular:     Rate and Rhythm:  Normal rate.  Pulmonary:     Effort: Pulmonary effort is normal. No respiratory distress.  Abdominal:     Palpations: Abdomen is soft.     Tenderness: There is no abdominal tenderness.  Musculoskeletal:     Cervical back: Neck supple.  Skin:    General: Skin is dry.  Neurological:     General: No focal deficit present.     Mental Status: She is alert. Mental status is at baseline.     Motor: No weakness.     Gait: Gait normal.  Psychiatric:        Mood and Affect: Mood normal.        Behavior: Behavior normal.      UC Treatments / Results  Labs (all labs ordered are listed, but only abnormal results are displayed) Labs Reviewed  URINALYSIS, W/ REFLEX TO CULTURE (INFECTION SUSPECTED)  CERVICOVAGINAL ANCILLARY ONLY    EKG   Radiology No results found.  Procedures Procedures (including critical care time)  Medications Ordered in UC Medications - No data to display  Initial Impression / Assessment and Plan / UC Course  I have reviewed the triage vital signs and the nursing notes.  Pertinent labs & imaging results that were available during my care of the patient were reviewed by me and considered in my medical decision making (see chart for details).   23 year old female presents for vaginal discharge for the past couple weeks.  History of BV and yeast infections.  Also requests urine test and STI screening.  No urinary symptoms.  Urinalysis and cervicovaginal (self-swab) obtained to check for BV/yeast/trichomonas/gonorrhea and chlamydia.  UA not consistent with UTI.   Acute vaginitis. Will treat at this time for suspected BV and yeast. Sent metronidazole  and diflucan  to pharmacy. Will amend therapy based on swab results. Supportive care. Return as needed.   Final Clinical Impressions(s) / UC Diagnoses   Final diagnoses:  Acute vaginitis  Vaginal discharge  Routine screening for STI (sexually transmitted infection)     Discharge Instructions      The  most common types of vaginal infections are yeast infections and bacterial vaginosis. Neither of which are really considered  to be sexually transmitted. Often a pH swab or wet prep is performed and if abnormal may reveal either type of infection. Begin metronidazole  if prescribed for possible BV infection. If there is concern for yeast infection, fluconazole  is often prescribed . Take this as directed. You may also apply topical miconazole (can be purchased OTC) externally for relief of itching. Increase rest and fluid intake. If labs sent out, we will call within 2-5 days with results and amend treatment if necessary. Always try to use pH balanced washes/wipes, urinate after intercourse, stay hydrated, and take probiotics if you are prone to vaginal infections. Return or see PCP or gynecologist for new/worsening infections.     ED Prescriptions   None    PDMP not reviewed this encounter.   Floydene Hy, PA-C 10/07/23 [phone]

## 2023-10-07 NOTE — ED Triage Notes (Signed)
 Pt presents with vaginal discharge and odor x 2 weeks. Pt would also likr STD testing.

## 2023-10-08 ENCOUNTER — Ambulatory Visit (HOSPITAL_COMMUNITY): Payer: Self-pay

## 2023-10-08 LAB — CERVICOVAGINAL ANCILLARY ONLY
Bacterial Vaginitis (gardnerella): POSITIVE — AB
Candida Glabrata: NEGATIVE
Candida Vaginitis: POSITIVE — AB
Chlamydia: NEGATIVE
Comment: NEGATIVE
Comment: NEGATIVE
Comment: NEGATIVE
Comment: NEGATIVE
Comment: NEGATIVE
Comment: NORMAL
Neisseria Gonorrhea: NEGATIVE
Trichomonas: NEGATIVE

## 2023-12-07 ENCOUNTER — Ambulatory Visit
Admission: EM | Admit: 2023-12-07 | Discharge: 2023-12-07 | Disposition: A | Discharge status: Home / Self Care | Attending: Physician Assistant | Admitting: Physician Assistant

## 2023-12-07 ENCOUNTER — Encounter: Payer: Self-pay | Admitting: Emergency Medicine

## 2023-12-07 DIAGNOSIS — N939 Abnormal uterine and vaginal bleeding, unspecified: Secondary | ICD-10-CM | POA: Insufficient documentation

## 2023-12-07 DIAGNOSIS — Z113 Encounter for screening for infections with a predominantly sexual mode of transmission: Secondary | ICD-10-CM | POA: Insufficient documentation

## 2023-12-07 LAB — PREGNANCY, URINE: Preg Test, Ur: NEGATIVE

## 2023-12-07 NOTE — ED Triage Notes (Signed)
 Pt c/o abnormal vaginal bleeding. She states it started about 6 days ago. She states she ended her period on 11/22/23 and started bleeding again Sunday. She states it is not enough blood to get on a pad. She states she had some mild cramping yesterday, took midol  and went away. She states she took a home pregnancy test and was negative.

## 2023-12-07 NOTE — Discharge Instructions (Addendum)
-  Negative pregnancy -Checking for STIs. Will call if positive -If heavy bleeding or pain go to ER

## 2023-12-07 NOTE — ED Provider Notes (Signed)
 [facility]    CSN: [id number] Arrival date & time: 12/07/23  1322      History   Chief Complaint Chief Complaint  Patient presents with   abnormal vaginal bleeding    HPI KELILAH HEBARD is a 23 y.o. female presenting for 5 to 6-day history of mild vaginal spotting.  Patient reports having sexual intercourse with a relatively new partner over the past month.  States she had sexual intercourse on Saturday and Sunday she noticed vaginal bleeding.  Reports the spotting is very mild and not even enough to wear a tampon or pad.  States she has been wearing light blue underwear today and has even noticed any blood in the underwear.  The blood is mostly only whenever she wipes.  She denies any blood in the actual urine.  Reports that she had some cramping yesterday but took Midol  and it went away completely.  Denies dysuria, frequency, urgency, vaginal discharge.  Last STI testing was 2 months ago but her current partner is not new since then.  She denies any particularly rough sexual intercourse before onset of bleeding.  States her menstrual periods are usually always regular and her last menstrual period started on July 13 and ended on July 18.  HPI  Past Medical History:  Diagnosis Date   GERD (gastroesophageal reflux disease)    Ovarian cyst 2016   right    Patient Active Problem List   Diagnosis Date Noted   Strep pharyngitis 04/02/2023   Vaginal discharge 09/12/2021   Vaginal yeast infection 09/12/2021   Encounter for elective induction of labor 06/09/2020   [redacted] weeks gestation of pregnancy 06/09/2020   Positive GBS test 06/02/2020   Supervision of normal pregnancy 12/09/2019   Susceptible to varicella (non-immune), currently pregnant 11/11/2019    Past Surgical History:  Procedure Laterality Date   NO PAST SURGERIES      OB History     Gravida  2   Para  1   Term  1   Preterm      AB  1   Living  1      SAB  1   IAB      Ectopic       Multiple  0   Live Births  1            Home Medications    Prior to Admission medications   Medication Sig Start Date End Date Taking? Authorizing Provider  phentermine 37.5 MG capsule Take 37.5 mg by mouth every morning. 04/02/23  Yes [provider]  fluconazole  (DIFLUCAN ) 150 MG tablet Take 1 tab po q 72 hr prn yeast infection 10/07/23   Arvis Huxley B, PA-C  lactulose  (CHRONULAC ) 10 GM/15ML solution Take 15 mLs (10 g total) by mouth 3 (three) times daily. 08/15/23   Brimage, Vondra, DO  methocarbamol  (ROBAXIN ) 500 MG tablet Take 1 tablet (500 mg total) by mouth 2 (two) times daily. 12/02/22   Brimage, Vondra, DO  naproxen  (NAPROSYN ) 500 MG tablet Take 1 tablet (500 mg total) by mouth 2 (two) times daily with a meal. 12/02/22   Brimage, Vondra, DO  omeprazole  (PRILOSEC  OTC) 20 MG tablet Take 2 tablets (40 mg total) by mouth daily. 12/02/22   Brimage, Vondra, DO    Family History History reviewed. No pertinent family history.  Social History Social History   Tobacco Use   Smoking status: Never   Smokeless tobacco: Never  Vaping Use   Vaping status:  Never Used  Substance Use Topics   Alcohol use: Yes    Comment: weekends   Drug use: No     Allergies   Patient has no known allergies.   Review of Systems Review of Systems  Constitutional:  Negative for fatigue.  Gastrointestinal:  Negative for abdominal pain, nausea and vomiting.  Genitourinary:  Positive for vaginal bleeding. Negative for dysuria, flank pain, frequency, hematuria, urgency, vaginal discharge and vaginal pain.  Musculoskeletal:  Negative for back pain.  Skin:  Negative for rash.     Physical Exam Triage Vital Signs ED Triage Vitals  Encounter Vitals Group     BP 12/07/23 1358 117/83     Girls Systolic BP Percentile --      Girls Diastolic BP Percentile --      Boys Systolic BP Percentile --      Boys Diastolic BP Percentile --      Pulse Rate 12/07/23 1358 89     Resp 12/07/23 1358  16     Temp 12/07/23 1358 98.7 F (37.1 C)     Temp Source 12/07/23 1358 Oral     SpO2 12/07/23 1358 98 %     Weight 12/07/23 1356 195 lb 1.7 oz (88.5 kg)     Height 12/07/23 1356 5' 3 (1.6 m)     Head Circumference --      Peak Flow --      Pain Score 12/07/23 1356 0     Pain Loc --      Pain Education --      Exclude from Growth Chart --    No data found.  Updated Vital Signs BP 117/83 (BP Location: Right Arm)   Pulse 89   Temp 98.7 F (37.1 C) (Oral)   Resp 16   Ht 5' 3 (1.6 m)   Wt 195 lb 1.7 oz (88.5 kg)   LMP 11/17/2023 (Exact Date)   SpO2 98%   BMI 34.56 kg/m    Physical Exam Vitals and nursing note reviewed.  Constitutional:      General: She is not in acute distress.    Appearance: Normal appearance. She is not ill-appearing or toxic-appearing.  HENT:     Head: Normocephalic and atraumatic.  Eyes:     General: No scleral icterus.       Right eye: No discharge.        Left eye: No discharge.     Conjunctiva/sclera: Conjunctivae normal.  Cardiovascular:     Rate and Rhythm: Normal rate.  Pulmonary:     Effort: Pulmonary effort is normal. No respiratory distress.  Abdominal:     Palpations: Abdomen is soft.     Tenderness: There is no abdominal tenderness.  Musculoskeletal:     Cervical back: Neck supple.  Skin:    General: Skin is dry.  Neurological:     General: No focal deficit present.     Mental Status: She is alert. Mental status is at baseline.     Motor: No weakness.     Gait: Gait normal.  Psychiatric:        Mood and Affect: Mood normal.        Behavior: Behavior normal.      UC Treatments / Results  Labs (all labs ordered are listed, but only abnormal results are displayed) Labs Reviewed  PREGNANCY, URINE  CERVICOVAGINAL ANCILLARY ONLY    EKG   Radiology No results found.  Procedures Procedures (including critical care time)  Medications Ordered  in UC Medications - No data to display  Initial Impression /  Assessment and Plan / UC Course  I have reviewed the triage vital signs and the nursing notes.  Pertinent labs & imaging results that were available during my care of the patient were reviewed by me and considered in my medical decision making (see chart for details).   23 year old female presents for very mild vaginal spotting over the past 5 to 6 days after sexual intercourse.  Slight cramping yesterday but it has resolved.  Last STI testing was all negative 2 months ago.  Denies vaginal discharge or urinary symptoms.  Urine pregnancy negative.  Will have patient self swab for GC/chlamydia/gonorrhea/BV and yeast.  She requested testing.  Advised patient we will call if any positive results. Encouraged going to ER if pain or heavy bleeding, otherwise a little spotting is not necessarily abnormal.    Final Clinical Impressions(s) / UC Diagnoses   Final diagnoses:  Vaginal spotting  Routine screening for STI (sexually transmitted infection)     Discharge Instructions      -Negative pregnancy -Checking for STIs. Will call if positive -If heavy bleeding or pain go to ER     ED Prescriptions   None    PDMP not reviewed this encounter.   Arvis Jolan NOVAK, PA-C 12/07/23 1451

## 2023-12-09 ENCOUNTER — Ambulatory Visit (HOSPITAL_COMMUNITY): Payer: Self-pay

## 2023-12-09 LAB — CERVICOVAGINAL ANCILLARY ONLY
Bacterial Vaginitis (gardnerella): POSITIVE — AB
Candida Glabrata: NEGATIVE
Candida Vaginitis: NEGATIVE
Chlamydia: NEGATIVE
Comment: NEGATIVE
Comment: NEGATIVE
Comment: NEGATIVE
Comment: NEGATIVE
Comment: NEGATIVE
Comment: NORMAL
Neisseria Gonorrhea: NEGATIVE
Trichomonas: NEGATIVE

## 2024-02-14 ENCOUNTER — Ambulatory Visit
Admission: EM | Admit: 2024-02-14 | Discharge: 2024-02-14 | Disposition: A | Discharge status: Home / Self Care | Attending: Emergency Medicine | Admitting: Emergency Medicine

## 2024-02-14 ENCOUNTER — Encounter: Payer: Self-pay | Admitting: *Deleted

## 2024-02-14 ENCOUNTER — Other Ambulatory Visit: Payer: Self-pay

## 2024-02-14 DIAGNOSIS — Z3201 Encounter for pregnancy test, result positive: Secondary | ICD-10-CM | POA: Diagnosis present

## 2024-02-14 DIAGNOSIS — N898 Other specified noninflammatory disorders of vagina: Secondary | ICD-10-CM | POA: Insufficient documentation

## 2024-02-14 LAB — PREGNANCY, URINE: Preg Test, Ur: POSITIVE — AB

## 2024-02-14 NOTE — Discharge Instructions (Addendum)
 Avoid baths, hot tubs and whirlpool spas.  Don't use scented or harsh soaps, such as those with deodorant or antibacterial action. Avoid irritants. These include scented tampons and pads. Wipe from front to back after using the toilet.  Don't douche. Your vagina doesn't require cleansing other than normal bathing.  Use a  condom. Wear cotton underwear, this fabric helps absorb moisture  Check my chart for results, if positive we will notify and send in treatment, if negative you will not be notified. Your pregnancy test was positive-take daily prenatal vitamins, call OB for appt

## 2024-02-14 NOTE — ED Provider Notes (Signed)
 [facility]    CSN: [id number] Arrival date & time: 02/14/24  1858      History   Chief Complaint Chief Complaint  Patient presents with   Possible Pregnancy   Vaginal Discharge   Vaginal Itching    HPI Sabrina Singh is a 23 y.o. female.   23 year old female, Sabrina Singh, presents to urgent care for evaluation of thick white vaginal discharge and vaginal itching x 2 days.  Patient reports she took a home pregnancy test yesterday that was positive patient requesting pregnancy test today. LMP was 01/10/24  The history is provided by the patient. No language interpreter was used.    Past Medical History:  Diagnosis Date   GERD (gastroesophageal reflux disease)    Ovarian cyst 2016   right    Patient Active Problem List   Diagnosis Date Noted   Positive pregnancy test 02/14/2024   Strep pharyngitis 04/02/2023   Vaginal discharge 09/12/2021   Vaginal yeast infection 09/12/2021   Encounter for elective induction of labor 06/09/2020   [redacted] weeks gestation of pregnancy 06/09/2020   Positive GBS test 06/02/2020   Supervision of normal pregnancy 12/09/2019   Susceptible to varicella (non-immune), currently pregnant 11/11/2019    Past Surgical History:  Procedure Laterality Date   NO PAST SURGERIES      OB History     Gravida  2   Para  1   Term  1   Preterm      AB  1   Living  1      SAB  1   IAB      Ectopic      Multiple  0   Live Births  1            Home Medications    Prior to Admission medications   Medication Sig Start Date End Date Taking? Authorizing Provider  omeprazole  (PRILOSEC  OTC) 20 MG tablet Take 2 tablets (40 mg total) by mouth daily. 12/02/22  Yes Brimage, Vondra, DO  semaglutide-weight management (WEGOVY) 1 MG/0.5ML SOAJ SQ injection Inject 1 mg into the skin once a week.   Yes [provider]  fluconazole  (DIFLUCAN ) 150 MG tablet Take 1 tab po q 72 hr prn yeast infection 10/07/23   Arvis Huxley B,  PA-C  lactulose  (CHRONULAC ) 10 GM/15ML solution Take 15 mLs (10 g total) by mouth 3 (three) times daily. 08/15/23   Brimage, Vondra, DO  methocarbamol  (ROBAXIN ) 500 MG tablet Take 1 tablet (500 mg total) by mouth 2 (two) times daily. 12/02/22   Brimage, Vondra, DO  naproxen  (NAPROSYN ) 500 MG tablet Take 1 tablet (500 mg total) by mouth 2 (two) times daily with a meal. 12/02/22   Brimage, Vondra, DO  phentermine 37.5 MG capsule Take 37.5 mg by mouth every morning. 04/02/23   [provider]    Family History History reviewed. No pertinent family history.  Social History Social History   Tobacco Use   Smoking status: Never   Smokeless tobacco: Never  Vaping Use   Vaping status: Never Used  Substance Use Topics   Alcohol use: Yes    Comment: weekends   Drug use: No     Allergies   Patient has no known allergies.   Review of Systems Review of Systems  Constitutional:  Negative for fever.  Gastrointestinal:  Negative for abdominal pain, diarrhea, nausea and vomiting.  Genitourinary:  Positive for frequency and vaginal discharge. Negative for dysuria.  Musculoskeletal: Negative.  All other systems reviewed and are negative.    Physical Exam Triage Vital Signs ED Triage Vitals  Encounter Vitals Group     BP 02/14/24 1913 111/74     Girls Systolic BP Percentile --      Girls Diastolic BP Percentile --      Boys Systolic BP Percentile --      Boys Diastolic BP Percentile --      Pulse Rate 02/14/24 1913 92     Resp 02/14/24 1913 18     Temp 02/14/24 1913 98.8 F (37.1 C)     Temp src --      SpO2 02/14/24 1913 98 %     Weight --      Height --      Head Circumference --      Peak Flow --      Pain Score 02/14/24 1908 0     Pain Loc --      Pain Education --      Exclude from Growth Chart --    No data found.  Updated Vital Signs BP 111/74   Pulse 92   Temp 98.8 F (37.1 C)   Resp 18   LMP 01/10/2024   SpO2 98%   Visual Acuity Right Eye  Distance:   Left Eye Distance:   Bilateral Distance:    Right Eye Near:   Left Eye Near:    Bilateral Near:     Physical Exam Vitals and nursing note reviewed.  Cardiovascular:     Rate and Rhythm: Normal rate.  Pulmonary:     Effort: Pulmonary effort is normal.  Genitourinary:    Comments: Deferred, pt self swabbed Neurological:     General: No focal deficit present.     Mental Status: She is alert and oriented to person, place, and time.     GCS: GCS eye subscore is 4. GCS verbal subscore is 5. GCS motor subscore is 6.  Psychiatric:        Attention and Perception: Attention normal.        Mood and Affect: Mood normal.        Speech: Speech normal.        Behavior: Behavior normal.      UC Treatments / Results  Labs (all labs ordered are listed, but only abnormal results are displayed) Labs Reviewed  PREGNANCY, URINE - Abnormal; Notable for the following components:      Result Value   Preg Test, Ur POSITIVE (*)    All other components within normal limits  CERVICOVAGINAL ANCILLARY ONLY    EKG   Radiology No results found.  Procedures Procedures (including critical care time)  Medications Ordered in UC Medications - No data to display  Initial Impression / Assessment and Plan / UC Course  I have reviewed the triage vital signs and the nursing notes.  Pertinent labs & imaging results that were available during my care of the patient were reviewed by me and considered in my medical decision making (see chart for details).    Discussed exam findings and plan of care with patient, postive pregnancy test, encouraged pt to discuss with PCP/OB how she wants to proceed with pregnancy, prenatal vitamins, advised may not treat BV in early pregnancy, if positive for yeast or STI will be notified and treated accordingly for pregnancy, strict go to ER precautions given.   Patient verbalized understanding to this provider.  Ddx: Positive urine preg test, vaginal  discharge Final Clinical  Impressions(s) / UC Diagnoses   Final diagnoses:  Vaginal discharge  Positive pregnancy test     Discharge Instructions      Avoid baths, hot tubs and whirlpool spas.  Don't use scented or harsh soaps, such as those with deodorant or antibacterial action. Avoid irritants. These include scented tampons and pads. Wipe from front to back after using the toilet.  Don't douche. Your vagina doesn't require cleansing other than normal bathing.  Use a  condom. Wear cotton underwear, this fabric helps absorb moisture  Check my chart for results, if positive we will notify and send in treatment, if negative you will not be notified. Your pregnancy test was positive-take daily prenatal vitamins, call OB for appt    ED Prescriptions   None    PDMP not reviewed this encounter.   Aminta Loose, NP 02/14/24 2043

## 2024-02-14 NOTE — ED Triage Notes (Signed)
 PT reports she had a positive home pregnancy test yesterday. Pt also has a thick white vag discharge and vaginal itching.. PT wants a pregnancy test.

## 2024-02-17 ENCOUNTER — Ambulatory Visit (HOSPITAL_COMMUNITY): Payer: Self-pay

## 2024-02-17 LAB — CERVICOVAGINAL ANCILLARY ONLY
Bacterial Vaginitis (gardnerella): POSITIVE — AB
Candida Glabrata: NEGATIVE
Candida Vaginitis: POSITIVE — AB
Chlamydia: NEGATIVE
Comment: NEGATIVE
Comment: NEGATIVE
Comment: NEGATIVE
Comment: NEGATIVE
Comment: NEGATIVE
Comment: NORMAL
Neisseria Gonorrhea: NEGATIVE
Trichomonas: NEGATIVE

## 2024-02-17 MED ORDER — CLOTRIMAZOLE 3 2 % VA CREA: 1.0000 | TOPICAL_CREAM | Freq: Every day | VAGINAL | 0 refills | Status: AC

## 2024-02-17 MED ORDER — METRONIDAZOLE 0.75 % VA GEL: 1.0000 | Freq: Every day | VAGINAL | 0 refills | Status: AC
